# Patient Record
Sex: Male | Born: 1995 | Race: White | Hispanic: No | Marital: Single | Smoking: Former smoker
Health system: Southern US, Community
[De-identification: ages and names within clinical notes are randomized; demographics above are authoritative.]

## PROBLEM LIST (undated history)

## (undated) DIAGNOSIS — F32A Depression, unspecified: Secondary | ICD-10-CM

## (undated) DIAGNOSIS — B2790 Infectious mononucleosis, unspecified without complication: Secondary | ICD-10-CM

## (undated) DIAGNOSIS — F419 Anxiety disorder, unspecified: Secondary | ICD-10-CM

## (undated) DIAGNOSIS — S060XAA Concussion with loss of consciousness status unknown, initial encounter: Secondary | ICD-10-CM

## (undated) DIAGNOSIS — S0990XA Unspecified injury of head, initial encounter: Secondary | ICD-10-CM

## (undated) DIAGNOSIS — J45909 Unspecified asthma, uncomplicated: Secondary | ICD-10-CM

## (undated) DIAGNOSIS — S060X9A Concussion with loss of consciousness of unspecified duration, initial encounter: Secondary | ICD-10-CM

## (undated) DIAGNOSIS — F909 Attention-deficit hyperactivity disorder, unspecified type: Secondary | ICD-10-CM

## (undated) HISTORY — DX: Infectious mononucleosis, unspecified without complication: B27.90

## (undated) HISTORY — PX: TONSILLECTOMY: SUR1361

## (undated) HISTORY — PX: CIRCUMCISION: SHX1350

## (undated) HISTORY — PX: WISDOM TOOTH EXTRACTION: SHX21

## (undated) HISTORY — PX: ADENOIDECTOMY: SUR15

## (undated) HISTORY — PX: TYMPANOSTOMY TUBE PLACEMENT: SHX32

---
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
## 2017-07-29 DIAGNOSIS — B2790 Infectious mononucleosis, unspecified without complication: Secondary | ICD-10-CM

## 2017-07-29 HISTORY — DX: Infectious mononucleosis, unspecified without complication: B27.90

## 2017-08-10 ENCOUNTER — Ambulatory Visit (INDEPENDENT_AMBULATORY_CARE_PROVIDER_SITE_OTHER): Payer: Self-pay | Admitting: Psychiatry

## 2017-08-10 ENCOUNTER — Encounter: Payer: Self-pay | Admitting: Psychiatry

## 2017-08-10 VITALS — BP 125/74 | HR 86 | Temp 98.5°F | Wt 140.0 lb

## 2017-08-10 DIAGNOSIS — R6889 Other general symptoms and signs: Secondary | ICD-10-CM

## 2017-08-10 LAB — POCT INFLUENZA A/B
INFLUENZA B, POC: NEGATIVE
Influenza A, POC: NEGATIVE

## 2017-08-10 MED ORDER — BENZONATATE 100 MG PO CAPS: 100.0000 mg | ORAL_CAPSULE | Freq: Two times a day (BID) | ORAL | 0 refills | Status: DC | PRN

## 2017-08-10 MED ORDER — ONDANSETRON HCL 4 MG PO TABS: 4.0000 mg | ORAL_TABLET | Freq: Three times a day (TID) | ORAL | 0 refills | Status: DC | PRN

## 2017-08-10 MED ORDER — CIPROFLOXACIN HCL 500 MG PO TABS: 500.0000 mg | ORAL_TABLET | Freq: Two times a day (BID) | ORAL | 0 refills | Status: AC

## 2017-08-10 NOTE — Patient Instructions (Addendum)
Nausea and Vomiting, Adult Feeling sick to your stomach (nausea) means that your stomach is upset or you feel like you have to throw up (vomit). Feeling more and more sick to your stomach can lead to throwing up. Throwing up happens when food and liquid from your stomach are thrown up and out the mouth. Throwing up can make you feel weak and cause you to get dehydrated. Dehydration can make you tired and thirsty, make you have a dry mouth, and make it so you pee (urinate) less often. Older adults and people with other diseases or a weak defense system (immune system) are at higher risk for dehydration. If you feel sick to your stomach or if you throw up, it is important to follow instructions from your doctor about how to take care of yourself. Follow these instructions at home: Eating and drinking Follow these instructions as told by your doctor:  Take an oral rehydration solution (ORS). This is a drink that is sold at pharmacies and stores.  Drink clear fluids in small amounts as you are able, such as: ? Water. ? Ice chips. ? Diluted fruit juice. ? Low-calorie sports drinks.  Eat bland, easy-to-digest foods in small amounts as you are able, such as: ? Bananas. ? Applesauce. ? Rice. ? Low-fat (lean) meats. ? Toast. ? Crackers.  Avoid fluids that have a lot of sugar or caffeine in them.  Avoid alcohol.  Avoid spicy or fatty foods.  General instructions  Drink enough fluid to keep your pee (urine) clear or pale yellow.  Wash your hands often. If you cannot use soap and water, use hand sanitizer.  Make sure that all people in your home wash their hands well and often.  Take over-the-counter and prescription medicines only as told by your doctor.  Rest at home while you get better.  Watch your condition for any changes.  Breathe slowly and deeply when you feel sick to your stomach.  Keep all follow-up visits as told by your doctor. This is important. Contact a doctor  if:  You have a fever.  You cannot keep fluids down.  Your symptoms get worse.  You have new symptoms.  You feel sick to your stomach for more than two days.  You feel light-headed or dizzy.  You have a headache.  You have muscle cramps. Get help right away if:  You have pain in your chest, neck, arm, or jaw.  You feel very weak or you pass out (faint).  You throw up again and again.  You see blood in your throw-up.  Your throw-up looks like black coffee grounds.  You have bloody or black poop (stools) or poop that look like tar.  You have a very bad headache, a stiff neck, or both.  You have a rash.  You have very bad pain, cramping, or bloating in your belly (abdomen).  You have trouble breathing.  You are breathing very quickly.  Your heart is beating very quickly.  Your skin feels cold and clammy.  You feel confused.  You have pain when you pee.  You have signs of dehydration, such as: ? Dark pee, hardly any pee, or no pee. ? Cracked lips. ? Dry mouth. ? Sunken eyes. ? Sleepiness. ? Weakness. These symptoms Lia be an emergency. Do not wait to see if the symptoms will go away. Get medical help right away. Call your local emergency services (911 in the [place] Do not drive yourself to the hospital. This information is   not intended to replace advice given to you by your health care provider. Make sure you discuss any questions you have with your health care provider. Document Released: 12/01/2007 Document Revised: 01/02/2016 Document Reviewed: 02/18/2015 Elsevier Interactive Patient Education  2018 [employer]  

## 2017-08-10 NOTE — Progress Notes (Signed)
  Subjective:     Ricardo Stevens is a 22 y.o. male who presents to [facility] accompanied by his mother for evaluation of flu like symptoms. Patient reports feeling generalized bodyache, feverish, chills. He reports symptoms started all of a sudden since last night. he reports being around his sister and her daughter who have been diagnosed with flu. He reports throwing up this morning and ongoing nausea. He denies any chest pain, abdominal pain. He reports fever. He has not tried anything. He reports generalized malaise and decreased appetite. He denies any recent  The following portions of the patient's history were reviewed and updated as appropriate: allergies, current medications, past family history, past medical history, past social history, past surgical history and problem list.  Review of Systems Pertinent items are noted in HPI.    Objective:     General appearance: alert, cooperative and fatigued Head: Normocephalic, without obvious abnormality, atraumatic Eyes: conjunctivae/corneas clear. PERRL, EOM's intact. Fundi benign. Ears: abnormal TM right ear - normal landmarks and mobility and abnormal TM left ear - serous middle ear fluid Nose: Nares normal. Septum midline. Mucosa normal. No drainage or sinus tenderness., turbinates red, inflamed Throat: lips, mucosa, and tongue normal; teeth and gums normal Lungs: clear to auscultation bilaterally Heart: regular rate and rhythm, S1, S2 normal, no murmur, click, rub or gallop Abdomen: abnormal findings:  hypoactive bowel sounds  Fruity breathe   Results for orders placed or performed in visit on 08/10/17 (from the past 24 hour(s))  POCT Influenza A/B     Status: Normal   Collection Time: 08/10/17  3:03 PM  Result Value Ref Range   Influenza A, POC Negative Negative   Influenza B, POC Negative Negative    Assessment:   Ricardo Stevens was seen today for choice-bodyache-chills vomit last nite.  Diagnoses and all orders for this  visit:  Flu-like symptoms -     ciprofloxacin (CIPRO) 500 MG tablet; Take 1 tablet (500 mg total) by mouth 2 (two) times daily for 6 days. -     ondansetron (ZOFRAN) 4 MG tablet; Take 1 tablet (4 mg total) by mouth every 8 (eight) hours as needed for nausea. -     POCT Influenza A/B  Other orders -     benzonatate (TESSALON) 100 MG capsule; Take 1 capsule (100 mg total) by mouth 2 (two) times daily as needed for cough.      Plan:    1. Discussed oral rehydration, reintroduction of solid foods, signs of dehydration. 2. Return or go to emergency department if worsening symptoms, blood or bile, signs of dehydration, diarrhea lasting longer than 5 days or any new concerns. 3. Follow PRN.

## 2017-08-13 ENCOUNTER — Ambulatory Visit (INDEPENDENT_AMBULATORY_CARE_PROVIDER_SITE_OTHER): Payer: Self-pay | Admitting: Medical

## 2017-08-13 ENCOUNTER — Encounter (HOSPITAL_COMMUNITY): Payer: Self-pay | Admitting: Family Medicine

## 2017-08-13 ENCOUNTER — Encounter: Payer: Self-pay | Admitting: Medical

## 2017-08-13 ENCOUNTER — Ambulatory Visit (HOSPITAL_COMMUNITY)
Admission: EM | Admit: 2017-08-13 | Discharge: 2017-08-13 | Disposition: A | Discharge status: Home / Self Care | Payer: 59 | Attending: Family Medicine | Admitting: Family Medicine

## 2017-08-13 ENCOUNTER — Ambulatory Visit (INDEPENDENT_AMBULATORY_CARE_PROVIDER_SITE_OTHER): Payer: 59

## 2017-08-13 VITALS — BP 128/78 | HR 81 | Temp 98.4°F | Resp 18 | Wt 146.4 lb

## 2017-08-13 DIAGNOSIS — F909 Attention-deficit hyperactivity disorder, unspecified type: Secondary | ICD-10-CM | POA: Diagnosis not present

## 2017-08-13 DIAGNOSIS — J45909 Unspecified asthma, uncomplicated: Secondary | ICD-10-CM | POA: Insufficient documentation

## 2017-08-13 DIAGNOSIS — R630 Anorexia: Secondary | ICD-10-CM | POA: Diagnosis not present

## 2017-08-13 DIAGNOSIS — R197 Diarrhea, unspecified: Secondary | ICD-10-CM | POA: Diagnosis not present

## 2017-08-13 DIAGNOSIS — F17210 Nicotine dependence, cigarettes, uncomplicated: Secondary | ICD-10-CM | POA: Insufficient documentation

## 2017-08-13 DIAGNOSIS — R509 Fever, unspecified: Secondary | ICD-10-CM | POA: Insufficient documentation

## 2017-08-13 DIAGNOSIS — R1084 Generalized abdominal pain: Secondary | ICD-10-CM

## 2017-08-13 DIAGNOSIS — R112 Nausea with vomiting, unspecified: Secondary | ICD-10-CM

## 2017-08-13 DIAGNOSIS — R51 Headache: Secondary | ICD-10-CM | POA: Diagnosis not present

## 2017-08-13 LAB — CBC
HCT: 43.9 % (ref 39.0–52.0)
HEMOGLOBIN: 15.3 g/dL (ref 13.0–17.0)
MCH: 30.9 pg (ref 26.0–34.0)
MCHC: 34.9 g/dL (ref 30.0–36.0)
MCV: 88.7 fL (ref 78.0–100.0)
PLATELETS: 180 10*3/uL (ref 150–400)
RBC: 4.95 MIL/uL (ref 4.22–5.81)
RDW: 12.6 % (ref 11.5–15.5)
WBC: 4.4 10*3/uL (ref 4.0–10.5)

## 2017-08-13 LAB — POCT I-STAT, CHEM 8
BUN: 15 mg/dL (ref 6–20)
CALCIUM ION: 1.15 mmol/L (ref 1.15–1.40)
Chloride: 99 mmol/L — ABNORMAL LOW (ref 101–111)
Creatinine, Ser: 1.1 mg/dL (ref 0.61–1.24)
Glucose, Bld: 91 mg/dL (ref 65–99)
HEMATOCRIT: 45 % (ref 39.0–52.0)
HEMOGLOBIN: 15.3 g/dL (ref 13.0–17.0)
Potassium: 4.9 mmol/L (ref 3.5–5.1)
SODIUM: 137 mmol/L (ref 135–145)
TCO2: 29 mmol/L (ref 22–32)

## 2017-08-13 LAB — POCT URINALYSIS DIP (DEVICE)
GLUCOSE, UA: NEGATIVE mg/dL
Hgb urine dipstick: NEGATIVE
Ketones, ur: NEGATIVE mg/dL
Leukocytes, UA: NEGATIVE
Nitrite: NEGATIVE
PH: 5.5 (ref 5.0–8.0)
PROTEIN: 30 mg/dL — AB
Specific Gravity, Urine: >=1.03 (ref 1.005–1.030)
UROBILINOGEN UA: 1 mg/dL (ref 0.0–1.0)

## 2017-08-13 MED ORDER — ONDANSETRON 4 MG PO TBDP: 4.0000 mg | ORAL_TABLET | Freq: Once | ORAL | Status: DC

## 2017-08-13 MED ORDER — ONDANSETRON HCL 4 MG/2ML IJ SOLN
4.0000 mg | Freq: Once | INTRAMUSCULAR | Status: AC
  Administered 2017-08-13: 4 mg via INTRAVENOUS

## 2017-08-13 MED ORDER — SODIUM CHLORIDE 0.9 % IV SOLN
Freq: Once | INTRAVENOUS | Status: AC
  Administered 2017-08-13: 16:00:00 via INTRAVENOUS

## 2017-08-13 MED ORDER — ONDANSETRON HCL 4 MG/2ML IJ SOLN
INTRAMUSCULAR | Status: AC
  Filled 2017-08-13: qty 2

## 2017-08-13 MED ORDER — ONDANSETRON 4 MG PO TBDP: 4.0000 mg | ORAL_TABLET | Freq: Three times a day (TID) | ORAL | 0 refills | Status: DC | PRN

## 2017-08-13 NOTE — Progress Notes (Signed)
Subjective:  Ricardo Stevens is a 22 y.o. male who presents for evaluation of N/V.  Symptoms include achiness, no fever and N/V.  Onset of symptoms was 5 days ago, and has been unchanged since that time.  Treatment to date:  Zofran, Cipro and Tesselon Pearls.  High risk factors for influenza complications:  none. Patient was [facility] 08/10/17 and had negative flu test. Was given Cipro, Zofran and Tesselon pearls for gastritis. Unable to keep anything down since then even with Zofran. No BM since Tuesday. Vomit is mostly bile with blood tinge. Fever up to 100.3 F yesterday. Patient denies abdominal pain but is having body aches.   The following portions of the patient's history were reviewed and updated as appropriate:  allergies, current medications and past medical history.  Pertinent items are noted in HPI. Objective:   Physical Exam  Constitutional: He is oriented to person, place, and time. He appears well-developed. He appears ill. No distress.  HENT:  Head: Normocephalic.  Eyes: EOM are normal.  Cardiovascular: Normal rate.  Respiratory: Effort normal.  GI: Soft. He exhibits no distension and no mass. Bowel sounds are decreased. There is tenderness (mild diffuse, no focal tenderness). There is no rebound and no guarding.  Neurological: He is alert and oriented to person, place, and time.  Skin: Skin is warm and dry. No erythema. There is pallor.  Psychiatric: He has a normal mood and affect. His behavior is normal.   MDM No concern fo acute abdomen Signs of dehydration and failed PO anti-emetics  Called [facility]. They are able to provide IV fluids and anti-emetics. This was at the request of the patient's mother who does not want to wait or pay for ER visit if not needed  Assessment:  Likely viral gastroenteritis Signs of dehydration  Plan:  [facility] for possible IV fluids and anti-emetics as well as further evaluation and management of  symptoms  Marny LowensteinWenzel, Modene Andy N, PA-C 08/13/2017 1:13 PM

## 2017-08-13 NOTE — Patient Instructions (Addendum)
Dehydration, Adult Dehydration is when there is not enough fluid or water in your body. This happens when you lose more fluids than you take in. Dehydration can range from mild to very bad. It should be treated right away to keep it from getting very bad. Symptoms of mild dehydration [place] include:  Thirst.  Dry lips.  Slightly dry mouth.  Dry, warm skin.  Dizziness. Symptoms of moderate dehydration [place] include:  Very dry mouth.  Muscle cramps.  Dark pee (urine). Pee Purves be the color of tea.  Your body making less pee.  Your eyes making fewer tears.  Heartbeat that is uneven or faster than normal (palpitations).  Headache.  Light-headedness, especially when you stand up from sitting.  Fainting (syncope). Symptoms of very bad dehydration [place] include:  Changes in skin, such as: ? Cold and clammy skin. ? Blotchy (mottled) or pale skin. ? Skin that does not quickly return to normal after being lightly pinched and let go (poor skin turgor).  Changes in body fluids, such as: ? Feeling very thirsty. ? Your eyes making fewer tears. ? Not sweating when body temperature is high, such as in hot weather. ? Your body making very little pee.  Changes in vital signs, such as: ? Weak pulse. ? Pulse that is more than 100 beats a minute when you are sitting still. ? Fast breathing. ? Low blood pressure.  Other changes, such as: ? Sunken eyes. ? Cold hands and feet. ? Confusion. ? Lack of energy (lethargy). ? Trouble waking up from sleep. ? Short-term weight loss. ? Unconsciousness. Follow these instructions at home:  If told by your doctor, drink an ORS: ? Make an ORS by using instructions on the package. ? Start by drinking small amounts, about  cup (120 mL) every 5-10 minutes. ? Slowly drink more until you have had the amount that your doctor said to have.  Drink enough clear fluid to keep your pee clear or pale yellow. If you were told to drink an ORS, finish the ORS  first, then start slowly drinking clear fluids. Drink fluids such as: ? Water. Do not drink only water by itself. Doing that can make the salt (sodium) level in your body get too low (hyponatremia). ? Ice chips. ? Fruit juice that you have added water to (diluted). ? Low-calorie sports drinks.  Avoid: ? Alcohol. ? Drinks that have a lot of sugar. These include high-calorie sports drinks, fruit juice that does not have water added, and soda. ? Caffeine. ? Foods that are greasy or have a lot of fat or sugar.  Take over-the-counter and prescription medicines only as told by your doctor.  Do not take salt tablets. Doing that can make the salt level in your body get too high (hypernatremia).  Eat foods that have minerals (electrolytes). Examples include bananas, oranges, potatoes, tomatoes, and spinach.  Keep all follow-up visits as told by your doctor. This is important. Contact a doctor if:  You have belly (abdominal) pain that: ? Gets worse. ? Stays in one area (localizes).  You have a rash.  You have a stiff neck.  You get angry or annoyed more easily than normal (irritability).  You are more sleepy than normal.  You have a harder time waking up than normal.  You feel: ? Weak. ? Dizzy. ? Very thirsty.  You have peed (urinated) only a small amount of very dark pee during 6-8 hours. Get help right away if:  You have symptoms of   very bad dehydration.  You cannot drink fluids without throwing up (vomiting).  Your symptoms get worse with treatment.  You have a fever.  You have a very bad headache.  You are throwing up or having watery poop (diarrhea) and it: ? Gets worse. ? Does not go away.  You have blood or something green (bile) in your throw-up.  You have blood in your poop (stool). This Kelly cause poop to look black and tarry.  You have not peed in 6-8 hours.  You pass out (faint).  Your heart rate when you are sitting still is more than 100 beats a  minute.  You have trouble breathing. This information is not intended to replace advice given to you by your health care provider. Make sure you discuss any questions you have with your health care provider. Document Released: 04/10/2009 Document Revised: 01/02/2016 Document Reviewed: 08/08/2015 Elsevier Interactive Patient Education  2018 [employer]   Go straight to Urgent Care @ [address], [place], [place] [address] for further evaluation and management of your symptoms

## 2017-08-13 NOTE — ED Provider Notes (Signed)
[facility]   [id number] 08/13/17 Arrival Time: 1340  ASSESSMENT & PLAN:  1. Non-intractable vomiting with nausea, unspecified vomiting type   2. Abdominal discomfort, generalized    Meds ordered this encounter  Medications  . 0.9 %  sodium chloride infusion  . ondansetron (ZOFRAN) injection 4 mg  . ondansetron (ZOFRAN-ODT) 4 MG disintegrating tablet    Sig: Take 1 tablet (4 mg total) by mouth every 8 (eight) hours as needed for nausea or vomiting.    Dispense:  15 tablet    Refill:  0   Feeling much better after IVF+ondansetron. Observe closely over the next 24-48 hours. Will f/u with PCP or here if needed. D/C Cipro. Doubt bacterial infection.  Will do his best to ensure adequate fluid intake in order to avoid dehydration. Will proceed to the Emergency Department for evaluation if unable to tolerate PO fluids regularly.  Otherwise he ill f/u with his PCP or here if not showing improvement over the next 48-72 hours.  Reviewed expectations re: course of current medical issues. Questions answered. Outlined signs and symptoms indicating need for more acute intervention. Patient verbalized understanding. After Visit Summary given.   SUBJECTIVE: History from: patient and caregiver.  Ricardo Stevens is a 22 y.o. male who presents with complaint of intermittent nausea and vomiting of brown material, no blood. Onset abrupt, 3 days ago. Abdominal discomfort: mild and cramping, esp after vomiting. Symptoms are unchanged since beginning. Aggravating factors: eating. Alleviating factors: fasting. Associated symptoms: anorexia, chills, fever and headache. He denies diarrhea. Appetite: decreased. PO intake: decreased. Ambulatory without assistance. Urinary symptoms: none. Last bowel movement 2-3 days ago without blood. OTC treatment: none. Went to an urgent care and was given Cipro and Zofran. Zofran helping with nausea. No recent travel reported. No sick contacts.  Past Surgical  History:  Procedure Laterality Date  . ADENOIDECTOMY    . CIRCUMCISION    . TONSILLECTOMY    . TYMPANOSTOMY TUBE PLACEMENT Bilateral     ROS: As per HPI. All other systems negative.  OBJECTIVE:  Vitals:   08/13/17 1430  BP: 121/82  Pulse: 83  Resp: 18  Temp: 99.3 F (37.4 C)  TempSrc: Oral  SpO2: 99%    General appearance: alert; no distress but appears fatigued Oropharynx: dry Lungs: unlabored respirations; symmetrical air entry Heart: regular rate and rhythm Abdomen: soft; non-distended; no significant abdominal tenderness, "just a cramping feeling"; bowel sounds present; no masses or organomegaly; no guarding or rebound tenderness Back: no CVA tenderness Extremities: no edema; symmetrical with no gross deformities Skin: warm and dry Neurologic: normal gait Psychological: alert and cooperative; normal mood and affect  Labs: Results for orders placed or performed during the hospital encounter of 08/13/17  CBC  Result Value Ref Range   WBC 4.4 4.0 - 10.5 K/uL   RBC 4.95 4.22 - 5.81 MIL/uL   Hemoglobin 15.3 13.0 - 17.0 g/dL   HCT 21.3 08.6 - 57.8 %   MCV 88.7 78.0 - 100.0 fL   MCH 30.9 26.0 - 34.0 pg   MCHC 34.9 30.0 - 36.0 g/dL   RDW 46.9 62.9 - 52.8 %   Platelets 180 150 - 400 K/uL  POCT urinalysis dip (device)  Result Value Ref Range   Glucose, UA NEGATIVE NEGATIVE mg/dL   Bilirubin Urine SMALL (A) NEGATIVE   Ketones, ur NEGATIVE NEGATIVE mg/dL   Specific Gravity, Urine >=1.030 1.005 - 1.030   Hgb urine dipstick NEGATIVE NEGATIVE   pH 5.5 5.0 -  8.0   Protein, ur 30 (A) NEGATIVE mg/dL   Urobilinogen, UA 1.0 0.0 - 1.0 mg/dL   Nitrite NEGATIVE NEGATIVE   Leukocytes, UA NEGATIVE NEGATIVE  I-STAT, chem 8  Result Value Ref Range   Sodium 137 135 - 145 mmol/L   Potassium 4.9 3.5 - 5.1 mmol/L   Chloride 99 (L) 101 - 111 mmol/L   BUN 15 6 - 20 mg/dL   Creatinine, Ser 1.611.10 0.61 - 1.24 mg/dL   Glucose, Bld 91 65 - 99 mg/dL   Calcium, Ion 0.961.15 0.451.15 - 1.40  mmol/L   TCO2 29 22 - 32 mmol/L   Hemoglobin 15.3 13.0 - 17.0 g/dL   HCT 40.945.0 81.139.0 - 91.452.0 %   Imaging: Dg Abd 2 Views  Result Date: 08/13/2017 CLINICAL DATA:  Nausea vomiting and diarrhea. EXAM: ABDOMEN - 2 VIEW COMPARISON:  09/12/2006 FINDINGS: Upright film shows no evidence for intraperitoneal free air. There is no evidence for gaseous bowel dilation to suggest obstruction. No unexpected abdominopelvic calcification. Visualized bony anatomy unremarkable. IMPRESSION: Negative. Electronically Signed   By: Kennith CenterEric  Mansell M.D.   On: 08/13/2017 15:15    No Known Allergies                                             Past Medical History:  Diagnosis Date  . ADHD (attention deficit hyperactivity disorder)   . Asthma   . Concussion   . Head injury, acute, without loss of consciousness    Social History   Socioeconomic History  . Marital status: Single    Spouse name: Not on file  . Number of children: Not on file  . Years of education: Not on file  . Highest education level: Not on file  Social Needs  . Financial resource strain: Not on file  . Food insecurity - worry: Not on file  . Food insecurity - inability: Not on file  . Transportation needs - medical: Not on file  . Transportation needs - non-medical: Not on file  Occupational History  . Not on file  Tobacco Use  . Smoking status: Current Every Day Smoker    Types: Cigarettes  . Smokeless tobacco: Never Used  Substance and Sexual Activity  . Alcohol use: Yes    Comment: has drank beer in past, not regularly  . Drug use: No  . Sexual activity: Yes    Birth control/protection: Condom  Other Topics Concern  . Not on file  Social History Narrative   Lives at home with Mom, Dad, and Sister. 3 dogs. Mom & Dad smoke outside, not in front of kids.   Family History  Problem Relation Age of Onset  . Hypertension Mother      Mardella LaymanHagler, Ricardo Carlile, MD 08/13/17 [phone]

## 2017-08-13 NOTE — Discharge Instructions (Signed)
Please do your best to ensure adequate fluid intake in order to avoid dehydration. If you find that you are unable to tolerate drinking fluids regularly please proceed to the Emergency Department for evaluation. ° °Also, you should return to the hospital if you experience persistent fevers for greater than 1-2 more days, increasing abdominal pain that persists despite medications, persistent diarrhea, dizziness, syncope (fainting), or for any other concerns you Cange deem worrisome. °

## 2017-08-13 NOTE — ED Triage Notes (Signed)
Pt here for N,V,D body aches, cough. This started Tuesday. unable to keep anything down. Severe vomiting last night.

## 2017-08-16 ENCOUNTER — Ambulatory Visit (INDEPENDENT_AMBULATORY_CARE_PROVIDER_SITE_OTHER): Payer: 59 | Admitting: Family Medicine

## 2017-08-16 ENCOUNTER — Other Ambulatory Visit: Payer: Self-pay

## 2017-08-16 ENCOUNTER — Encounter: Payer: Self-pay | Admitting: Family Medicine

## 2017-08-16 VITALS — BP 120/82 | HR 96 | Resp 16 | Wt 147.0 lb

## 2017-08-16 DIAGNOSIS — R509 Fever, unspecified: Secondary | ICD-10-CM

## 2017-08-16 DIAGNOSIS — R1110 Vomiting, unspecified: Secondary | ICD-10-CM

## 2017-08-16 DIAGNOSIS — R197 Diarrhea, unspecified: Secondary | ICD-10-CM | POA: Diagnosis not present

## 2017-08-16 LAB — POCT URINALYSIS DIPSTICK
GLUCOSE UA: NEGATIVE
Leukocytes, UA: NEGATIVE
Nitrite, UA: NEGATIVE
PH UA: 5.5 (ref 5.0–8.0)
Protein, UA: 30
RBC UA: NEGATIVE
Spec Grav, UA: >=1.03 — AB (ref 1.010–1.025)
UROBILINOGEN UA: 0.2 U/dL

## 2017-08-16 LAB — POCT INFLUENZA A/B
INFLUENZA A, POC: NEGATIVE
INFLUENZA B, POC: NEGATIVE

## 2017-08-16 LAB — POCT RAPID STREP A (OFFICE): RAPID STREP A SCREEN: NEGATIVE

## 2017-08-16 MED ORDER — CIPROFLOXACIN HCL 500 MG PO TABS
500.0000 mg | ORAL_TABLET | Freq: Two times a day (BID) | ORAL | 0 refills | Status: DC
Start: 2017-08-16 — End: 2018-01-31

## 2017-08-16 NOTE — Progress Notes (Signed)
Patient ID: Ricardo Stevens, male    DOB: 1995/07/04, 22 y.o.   MRN: [id number]  Chief Complaint  Patient presents with  . Fever    103.3 highest per patient, Sunday night  . Nausea    Allergies Patient has no known allergies.  Subjective:   Ricardo Stevens is a 22 y.o. male who presents to [facility] today.  HPI Ricardo Stevens presents today accompanied by his sister for an acute visit and to establish care.  On February 12,  he woke up in the morning feeling weak and sick.  He reports that he did not feel like getting out of bed but he did and he proceeded to eat breakfast and vomited.  He reports that he continued to vomit throughout the day and did not feel good.  The next [facility].  A flu test was performed which was negative.  He was given a prescription for Cipro and discharged home.  He continued to have fevers and vomiting.  Went to the urgent care clinic on 08/13/2017 and was told that they thought his symptoms were viral.  Was given IV fluids for dehydration, given Zofran as an anti-medics, and then told to discontinue the Cipro.  He had a CBC and BMP performed which were within normal limits.  He has had a flu test performed which was negative.  He has continued to vomit on and off since that time.  Then 2 days ago started having diarrhea.  Has proceeded to have diarrhea approximately 4-5 times a day, brown/clear, voluminous in quantity.  Has also had continued vomiting.  Does not feel dehydrated or dizzy.  Urinating well.  Denies any abdominal pain except has some cramping prior to the diarrhea.  All emesis has been nonbloody and nonbilious.  Emesis and diarrhea are worse with eating.  He is continued to eat whatever he wants and has been drinking Sprite or Gatorade.  Reports that he does feel better than he did when he was seen at the urgent care clinic on Saturday, February 16.  He is still proceeded to have fevers.  The highest fever he has  had was 2 days ago with a temperature of 103 degrees.  He has been using Advil 200 mg tablets, 1 pill, 4-5 times a day.  He denies any blood in his stool.  Denies any melena or bright red blood per rectum.  No hematochezia.  Denies any neck stiffness.  Denies any sore throat.  Denies any sick contacts.  Has not been out of the country or camping.   Diarrhea   This is a new problem. The current episode started in the past 7 days. The problem occurs 2 to 4 times per day. The problem has been unchanged. The stool consistency is described as watery. The patient states that diarrhea does not awaken him from sleep. Associated symptoms include a fever and vomiting. Pertinent negatives include no abdominal pain, arthralgias, chills, coughing, headaches, increased  flatus, myalgias, sweats, URI or weight loss. Associated symptoms comments: Reports abdominal cramping prior/preceding diarrhea.  Cramping resolved with diarrheal episode.. Nothing aggravates the symptoms. There are no known risk factors. He has tried increased fluids for the symptoms. The treatment provided mild relief.  Fever   This is a new problem. The current episode started in the past 7 days. The problem occurs intermittently. The maximum temperature noted was 103 to 103.9 F. The temperature was taken using  an oral thermometer. Associated symptoms include diarrhea, nausea and vomiting. Pertinent negatives include no abdominal pain, chest pain, congestion, coughing, ear pain, headaches, muscle aches, rash, sleepiness, sore throat, urinary pain or wheezing. He has tried fluids and NSAIDs for the symptoms. The treatment provided mild relief.  Risk factors: no contaminated food, no contaminated water, no hx of cancer, no immunosuppression, no occupational exposure, no recent sickness, no recent travel and no sick contacts     Past Medical History:  Diagnosis Date  . ADHD (attention deficit hyperactivity disorder)   . Asthma   . Concussion   .  Head injury, acute, without loss of consciousness     Past Surgical History:  Procedure Laterality Date  . ADENOIDECTOMY    . CIRCUMCISION    . TONSILLECTOMY    . TYMPANOSTOMY TUBE PLACEMENT Bilateral     Family History  Problem Relation Age of Onset  . Hypertension Mother   . Diabetes Maternal Grandmother   . Diabetes Maternal Grandfather   . Diabetes Paternal Grandmother   . Breast cancer Paternal Grandmother   . Diabetes Paternal Grandfather      Social History   Socioeconomic History  . Marital status: Single    Spouse name: None  . Number of children: None  . Years of education: None  . Highest education level: None  Social Needs  . Financial resource strain: None  . Food insecurity - worry: None  . Food insecurity - inability: None  . Transportation needs - medical: None  . Transportation needs - non-medical: None  Occupational History  . None  Tobacco Use  . Smoking status: Current Every Day Smoker    Types: Cigarettes  . Smokeless tobacco: Never Used  Substance and Sexual Activity  . Alcohol use: Yes    Comment: has drank beer in past, not regularly  . Drug use: No  . Sexual activity: Yes    Birth control/protection: Condom  Other Topics Concern  . None  Social History Narrative   Lives at home with Mom, Dad, and Sister. 3 dogs. Mom & Dad smoke outside, not in front of kids.    Review of Systems  Constitutional: Positive for fatigue and fever. Negative for activity change, appetite change, chills, diaphoresis, unexpected weight change and weight loss.       3 days diarrhea.   HENT: Negative for congestion, dental problem, ear pain, hearing loss, rhinorrhea, sore throat, trouble swallowing and voice change.   Eyes: Negative for visual disturbance.  Respiratory: Negative for cough, chest tightness, shortness of breath and wheezing.   Cardiovascular: Negative for chest pain, palpitations and leg swelling.  Gastrointestinal: Positive for diarrhea,  nausea and vomiting. Negative for abdominal distention, abdominal pain, anal bleeding, blood in stool, constipation, flatus and rectal pain.       3-4 times, watery, loose, come on all of a suddden.  crampy stools.   No travel.   Endocrine: Negative for polydipsia, polyphagia and polyuria.  Genitourinary: Negative for decreased urine volume, difficulty urinating, discharge, dysuria, flank pain, frequency, penile pain, penile swelling, scrotal swelling, testicular pain and urgency.  Musculoskeletal: Negative for arthralgias, myalgias, neck pain and neck stiffness.  Skin: Negative for rash.  Allergic/Immunologic: Negative for immunocompromised state.  Neurological: Negative for dizziness, tremors, syncope, weakness and headaches.  Hematological: Negative for adenopathy. Does not bruise/bleed easily.     Objective:   BP 120/82 (BP Location: Right Arm, Patient Position: Sitting, Cuff Size: Normal)   Pulse 96   Resp 16  Wt 147 lb (66.7 kg)   SpO2 98%   BMI 23.73 kg/m   Physical Exam  Constitutional: He is oriented to person, place, and time. He appears well-developed and well-nourished. No distress.  HENT:  Head: Normocephalic and atraumatic.  Nose: Nose normal.  Mouth/Throat: Oropharynx is clear and moist. No oropharyngeal exudate.  Eyes: Conjunctivae and EOM are normal. Pupils are equal, round, and reactive to light. No scleral icterus.  Neck: Normal range of motion. Neck supple. No JVD present. No tracheal deviation present. No thyromegaly present.  Cardiovascular: Normal rate, regular rhythm and normal heart sounds.  Pulses:      Dorsalis pedis pulses are 2+ on the right side, and 2+ on the left side.  Pulmonary/Chest: Effort normal and breath sounds normal. No stridor. No respiratory distress. He has no wheezes.  Abdominal: Soft. Bowel sounds are normal. There is no tenderness. There is no guarding.  Mild, periumbilical tenderness to palpation.  No hepatosplenomegaly palpated.    Musculoskeletal: Normal range of motion. He exhibits no edema.  Lymphadenopathy:    He has no cervical adenopathy.  Neurological: He is alert and oriented to person, place, and time. No cranial nerve deficit. Coordination normal.  Skin: Skin is warm, dry and intact. Capillary refill takes less than 2 seconds. He is not diaphoretic. No pallor.  Psychiatric: He has a normal mood and affect. His behavior is normal. Thought content normal.  Vitals reviewed.  Plan of care urine test done in office today and reviewed and discussed with patient.  Assessment and Plan  1. Fever, unspecified fever cause Patient with persistent fever, initially thought by [facility] secondary to upper respiratory infection/VGE. POCT today done and Rapid strep test and flu test have been negative.  At this time suspect that he has a bacterial gastroenteritis.  We will go ahead and perform stool culture and GI panel by PCR.  Patient does not appear dehydrated at this time.  He reports he is drinking well and making good urine.  He defers any labs today and does not want them done.  We will go ahead and start Cipro 500 mg 1 p.o. twice daily times 7 days at this time.  Patient is to call back in 24 hours and if he is not improving then he will need to come in for additional blood work.  He was counseled that if he at any point developed abdominal pain outside of just the mild cramping with the diarrhea that he needed to go to the emergency department for evaluation.  We discussed signs and symptoms consistent with dehydration and if those do occur he should go to the emergency department for evaluation and IV fluids.  He was told that he could use the Zofran 4 mg tablets, 2 p.o. 3 times daily as needed nausea and vomiting.  We discussed an appropriate bland diet.  We discussed that his diet has not been appropriate considering his symptoms.  Patient was counseled concerning worrisome signs and symptoms and told to call  or follow-up if they develop.  He voiced understanding. -Point-of-care urine test performed which revealed evidence of mild dehydration.  Glucose negative.  Protein in urine slight.  No evidence of infection. - POCT rapid strep A - POCT Influenza A/B  2. Vomiting and diarrhea  - POCT Urinalysis Dipstick - Stool Culture - Gastrointestinal Panel by PCR , Stool - ciprofloxacin (CIPRO) 500 MG tablet; Take 1 tablet (500 mg total) by mouth 2 (two) times daily.  Dispense: 14  tablet; Refill: 0 Patient counseled in detail regarding the risks of medication. Told to call or return to clinic if develop any worrisome signs or symptoms. Patient voiced understanding.   Return if symptoms worsen or fail to improve. Aliene Beamsachel Lilly Gasser, MD 08/16/2017

## 2017-08-17 ENCOUNTER — Telehealth: Payer: Self-pay | Admitting: Family Medicine

## 2017-08-17 DIAGNOSIS — R112 Nausea with vomiting, unspecified: Secondary | ICD-10-CM

## 2017-08-18 ENCOUNTER — Ambulatory Visit (HOSPITAL_COMMUNITY): Payer: 59

## 2017-08-18 ENCOUNTER — Other Ambulatory Visit: Payer: Self-pay

## 2017-08-18 ENCOUNTER — Other Ambulatory Visit: Payer: Self-pay | Admitting: Family Medicine

## 2017-08-18 ENCOUNTER — Ambulatory Visit (HOSPITAL_COMMUNITY)
Admission: RE | Admit: 2017-08-18 | Discharge: 2017-08-18 | Disposition: A | Discharge status: Home / Self Care | Payer: 59 | Source: Ambulatory Visit | Attending: Family Medicine | Admitting: Family Medicine

## 2017-08-18 ENCOUNTER — Ambulatory Visit (INDEPENDENT_AMBULATORY_CARE_PROVIDER_SITE_OTHER): Payer: 59 | Admitting: Family Medicine

## 2017-08-18 ENCOUNTER — Encounter: Payer: Self-pay | Admitting: Family Medicine

## 2017-08-18 ENCOUNTER — Other Ambulatory Visit (HOSPITAL_COMMUNITY)
Admission: RE | Admit: 2017-08-18 | Discharge: 2017-08-18 | Disposition: A | Discharge status: Home / Self Care | Payer: 59 | Source: Ambulatory Visit | Attending: Family Medicine | Admitting: Family Medicine

## 2017-08-18 VITALS — BP 128/70 | HR 97 | Temp 99.3°F | Resp 16 | Wt 145.0 lb

## 2017-08-18 DIAGNOSIS — R1110 Vomiting, unspecified: Secondary | ICD-10-CM | POA: Diagnosis not present

## 2017-08-18 DIAGNOSIS — R109 Unspecified abdominal pain: Secondary | ICD-10-CM | POA: Diagnosis not present

## 2017-08-18 DIAGNOSIS — R112 Nausea with vomiting, unspecified: Secondary | ICD-10-CM

## 2017-08-18 DIAGNOSIS — R161 Splenomegaly, not elsewhere classified: Secondary | ICD-10-CM | POA: Insufficient documentation

## 2017-08-18 DIAGNOSIS — Z113 Encounter for screening for infections with a predominantly sexual mode of transmission: Secondary | ICD-10-CM

## 2017-08-18 DIAGNOSIS — R300 Dysuria: Secondary | ICD-10-CM | POA: Diagnosis not present

## 2017-08-18 DIAGNOSIS — D735 Infarction of spleen: Secondary | ICD-10-CM | POA: Diagnosis not present

## 2017-08-18 DIAGNOSIS — R509 Fever, unspecified: Secondary | ICD-10-CM

## 2017-08-18 HISTORY — DX: Splenomegaly, not elsewhere classified: R16.1

## 2017-08-18 LAB — POCT URINALYSIS DIPSTICK
Bilirubin, UA: NEGATIVE
Blood, UA: NEGATIVE
GLUCOSE UA: NEGATIVE
KETONES UA: NEGATIVE
Leukocytes, UA: NEGATIVE
Nitrite, UA: NEGATIVE
Protein, UA: NEGATIVE
Spec Grav, UA: <=1.005 — AB (ref 1.010–1.025)
UROBILINOGEN UA: 0.2 U/dL
pH, UA: 5.5 (ref 5.0–8.0)

## 2017-08-18 MED ORDER — IOPAMIDOL (ISOVUE-300) INJECTION 61%
100.0000 mL | Freq: Once | INTRAVENOUS | Status: AC | PRN
  Administered 2017-08-18: 100 mL via INTRAVENOUS

## 2017-08-18 MED ORDER — IOPAMIDOL (ISOVUE-300) INJECTION 61%: 100.0000 mL | Freq: Once | INTRAVENOUS | Status: DC | PRN

## 2017-08-18 NOTE — Telephone Encounter (Signed)
Ordered and scheduled CT as directed by Dr .Tracie HarrierHagler.

## 2017-08-18 NOTE — Progress Notes (Deleted)
    Patient ID: Ricardo Stevens, male    DOB: December 08, 1995, 22 y.o.   MRN: [id number]  Chief Complaint  Patient presents with  . Follow-up    Allergies Patient has no known allergies.  Subjective:   Ricardo Stevens is a 22 y.o. male who [facility].  HPI HPI  Past Medical History:  Diagnosis Date  . ADHD (attention deficit hyperactivity disorder)   . Asthma   . Concussion   . Head injury, acute, without loss of consciousness     Past Surgical History:  Procedure Laterality Date  . ADENOIDECTOMY    . CIRCUMCISION    . TONSILLECTOMY    . TYMPANOSTOMY TUBE PLACEMENT Bilateral     Family History  Problem Relation Age of Onset  . Hypertension Mother   . Diabetes Maternal Grandmother   . Diabetes Maternal Grandfather   . Diabetes Paternal Grandmother   . Breast cancer Paternal Grandmother   . Diabetes Paternal Grandfather      Social History   Socioeconomic History  . Marital status: Single    Spouse name: Not on file  . Number of children: Not on file  . Years of education: Not on file  . Highest education level: Not on file  Social Needs  . Financial resource strain: Not on file  . Food insecurity - worry: Not on file  . Food insecurity - inability: Not on file  . Transportation needs - medical: Not on file  . Transportation needs - non-medical: Not on file  Occupational History  . Not on file  Tobacco Use  . Smoking status: Current Every Day Smoker    Types: Cigarettes  . Smokeless tobacco: Never Used  Substance and Sexual Activity  . Alcohol use: Yes    Comment: has drank beer in past, not regularly  . Drug use: No  . Sexual activity: Yes    Birth control/protection: Condom  Other Topics Concern  . Not on file  Social History Narrative   Lives at home with Mom, Dad, and Sister. 3 dogs. Mom & Dad smoke outside, not in front of kids.    Review of Systems   Objective:   BP 128/70 (BP Location: Left Arm, Patient Position:  Sitting, Cuff Size: Normal)   Pulse 97   Temp 99.3 F (37.4 C) (Oral)   Resp 16   Wt 145 lb (65.8 kg)   SpO2 98%   BMI 23.40 kg/m   Physical Exam   Assessment and Plan   [facility], he was reading the scan,    No Follow-up on file. Mack HookBreanna  Johnson, LPN 4/09/81192/21/2019

## 2017-08-18 NOTE — Progress Notes (Signed)
Patient ID: Ricardo Stevens, male    DOB: 09-28-1995, 22 y.o.   MRN: [id number]  Chief Complaint  Patient presents with  . Follow-up    Allergies Patient has no known allergies.  Subjective:   Ricardo Stevens is a 22 y.o. male who [facility].  HPI Ricardo FearingJames presents back to the office today for a follow-up.  He had a CT scan of the abdomen and pelvis performed this morning at [facility].  He has continued to have fevers and vomiting since he was last seen here.  He reports that he has been able to keep down solids but if he drinks a lot of fluids he does have vomiting.  He reports that the emesis is nonbloody and nonbilious.  Reports that it is acidic in nature and burns his throat.  He reports that last night he did have a worsening in his abdominal pain and reports that he has had some right-sided upper and right, lower abdominal pain.  He reports he does have some burning with urination.  He reports that nothing seems to make the pain better or worse.  The pain is intermittent and occasionally worse with movement.  He denies any penile discharge.  He has not had a bowel movement in 2 days.  He has had no more diarrhea.  He has eaten toast, Jamaica[place] fries and bread without difficulty.  His fevers last night were as high as 102 degrees.  He reports he is urinating normally without urgency or frequency but does have a little bit of stinging when he urinates.  He reports this is been going on for about 1 week. He does report some fatigue.  He is not passed out.  He does not feel terribly weak.  He feels like his heart is beating okay.  Denies any shortness of breath or palpitations.  He denies any tobacco, alcohol or drug use.  He is sexually active but has not ever been treated for any sexually transmitted infections.  He denies any sore throat or cough.  He denies any weight loss.    Past Medical History:  Diagnosis Date  . ADHD (attention deficit hyperactivity  disorder)   . Asthma   . Concussion   . Head injury, acute, without loss of consciousness     Past Surgical History:  Procedure Laterality Date  . ADENOIDECTOMY    . CIRCUMCISION    . TONSILLECTOMY    . TYMPANOSTOMY TUBE PLACEMENT Bilateral     Family History  Problem Relation Age of Onset  . Hypertension Mother   . Diabetes Maternal Grandmother   . Diabetes Maternal Grandfather   . Diabetes Paternal Grandmother   . Breast cancer Paternal Grandmother   . Diabetes Paternal Grandfather      Social History   Socioeconomic History  . Marital status: Single    Spouse name: Not on file  . Number of children: Not on file  . Years of education: Not on file  . Highest education level: Not on file  Social Needs  . Financial resource strain: Not on file  . Food insecurity - worry: Not on file  . Food insecurity - inability: Not on file  . Transportation needs - medical: Not on file  . Transportation needs - non-medical: Not on file  Occupational History  . Not on file  Tobacco Use  . Smoking status: Current Every Day Smoker    Types: Cigarettes  . Smokeless  tobacco: Never Used  Substance and Sexual Activity  . Alcohol use: Yes    Comment: has drank beer in past, not regularly  . Drug use: No  . Sexual activity: Yes    Birth control/protection: Condom  Other Topics Concern  . Not on file  Social History Narrative   Lives at home with Mom, Dad, and Sister. 3 dogs. Mom & Dad smoke outside, not in front of kids.    Review of Systems  Constitutional: Positive for chills, fatigue and fever. Negative for unexpected weight change.  HENT: Negative for congestion, mouth sores, sinus pressure, sinus pain, sneezing, sore throat, trouble swallowing and voice change.   Eyes: Negative for visual disturbance.  Respiratory: Negative for cough, choking, chest tightness, shortness of breath and stridor.   Cardiovascular: Negative for chest pain, palpitations and leg swelling.    Gastrointestinal: Positive for abdominal pain, nausea and vomiting. Negative for constipation and diarrhea.  Genitourinary: Positive for dysuria. Negative for decreased urine volume, difficulty urinating, discharge, frequency, genital sores, hematuria, penile pain, scrotal swelling, testicular pain and urgency.  Skin: Negative for rash.  Neurological: Negative for dizziness, weakness and numbness.  Hematological: Negative for adenopathy. Does not bruise/bleed easily.  Psychiatric/Behavioral: Negative for agitation and dysphoric mood.     Objective:   BP 128/70 (BP Location: Left Arm, Patient Position: Sitting, Cuff Size: Normal)   Pulse 97   Temp 99.3 F (37.4 C) (Oral)   Resp 16   Wt 145 lb (65.8 kg)   SpO2 98%   BMI 23.40 kg/m   Physical Exam  Constitutional: He is oriented to person, place, and time. He appears well-developed and well-nourished. No distress.  HENT:  Head: Normocephalic and atraumatic.  Right Ear: External ear normal.  Left Ear: External ear normal.  Nose: Nose normal.  Mouth/Throat: Oropharynx is clear and moist. No oropharyngeal exudate.  Eyes: Conjunctivae and EOM are normal. Pupils are equal, round, and reactive to light. No scleral icterus.  Neck: Normal range of motion. Neck supple.  Shotty submandibular lymphadenopathy present.  Nontender to palpation.  Cardiovascular: Normal rate, regular rhythm and normal heart sounds.  Pulmonary/Chest: Effort normal and breath sounds normal. No stridor. No respiratory distress. He has no wheezes.  Abdominal: Soft. Bowel sounds are normal.  Patient does have some right upper and right lower quadrant tenderness to palpation.  The edge of the spleen is palpable.  No hepatomegaly present.  Negative Murphy sign.  Positive bowel sounds.  No rebound present.  Genitourinary:  Genitourinary Comments: Normal male genitalia.  No inguinal lymphadenopathy present.  Musculoskeletal: Normal range of motion.  Neurological: He is  alert and oriented to person, place, and time.  Skin: Skin is warm and dry. Capillary refill takes less than 2 seconds. He is not diaphoretic.  Psychiatric: He has a normal mood and affect. His behavior is normal. Judgment and thought content normal.     Assessment and Plan   1. Dysuria Check for UTI and STI at this time.  - POCT urinalysis dipstick - Urine cytology ancillary only - Urine Culture  2. Screening examination for STD (sexually transmitted disease) Screening performed.  - HIV antibody (with reflex)  3. Fever, unspecified fever cause Check labs.  - Hepatitis panel, acute  4. Splenomegaly Spoke with Dr.Mark Myra Gianotti on the phone today regarding patient CT scan of the abdomen and pelvis.  We discussed that his spleen is approximately 918 cm.  This is approximately a little over 2 times average size.  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  They did not believe this was a result of trauma.  He believes that due to the enlargement of the spleen that the infarction is secondary to lack of blood flow to those areas.  We reviewed the CT scan in detail.  There are no acute findings on the CT scan specifically no gallbladder, liver, appendix or bowel abnormalities.  There were no abscesses noted.  He did have a small amount of free fluid that was present in the pelvis which was thought possibly secondary to the splenomegaly.  Patient does not have any left upper quadrant tenderness.  Results of CT scan were discussed with patient and his mother in detail today at the office visit.  We did discuss that at this point will rule out patient for mononucleosis which could be the cause of the splenomegaly.  Will check blood counts and blood smear at this time.  We will also check electrolytes, liver function, HIV, and hepatitis profile.  We did discuss that splenomegaly does increase his risk of splenic rupture.  He was counseled not to engage  in any contact sports or behavior that would put him at risk for trauma to the abdomen.  He voiced understanding.  Did discuss that his nausea and vomiting could be a result of the enlarged spleen causing pressure on his stomach.  He will continue to drink small amounts of fluid on a very frequent basis and continue the diet as we have discussed.  Patient and mom were counseled concerning worrisome signs and symptoms and told if those develop to please go to the emergency department or contact help.  Will await lab test and then I will contact family.  In the meantime, if there are any changes in his condition or if they have any questions or concerns they will contact me.  Patient will continue to use Tylenol or Advil over-the-counter as directed for his fevers.  Still suspect at this time the patient's fevers are secondary to viral etiology.  He will go ahead and continue the Cipro as previously described.  Still waiting on stool cultures.   - CBC with Differential/Platelet - Epstein-Barr virus VCA antibody panel - Monospot - COMPLETE METABOLIC PANEL WITH GFR  Return if symptoms worsen or fail to improve. Aliene Beams, MD 08/18/2017

## 2017-08-19 ENCOUNTER — Telehealth: Payer: Self-pay | Admitting: Family Medicine

## 2017-08-19 ENCOUNTER — Other Ambulatory Visit (HOSPITAL_COMMUNITY)
Admission: RE | Admit: 2017-08-19 | Discharge: 2017-08-19 | Disposition: A | Discharge status: Home / Self Care | Payer: 59 | Source: Other Acute Inpatient Hospital | Attending: Family Medicine | Admitting: Family Medicine

## 2017-08-19 DIAGNOSIS — R300 Dysuria: Secondary | ICD-10-CM | POA: Insufficient documentation

## 2017-08-19 LAB — COMPLETE METABOLIC PANEL WITH GFR
AG Ratio: 1.7 (calc) (ref 1.0–2.5)
ALT: 27 U/L (ref 8–46)
AST: 28 U/L (ref 12–32)
Albumin: 4.3 g/dL (ref 3.6–5.1)
Alkaline phosphatase (APISO): 82 U/L (ref 48–230)
BUN: 8 mg/dL (ref 7–20)
CALCIUM: 9.3 mg/dL (ref 8.9–10.4)
CO2: 27 mmol/L (ref 20–32)
CREATININE: 0.94 mg/dL (ref 0.60–1.26)
Chloride: 102 mmol/L (ref 98–110)
GFR, EST NON AFRICAN AMERICAN: 117 mL/min/{1.73_m2} (ref >=60)
GFR, Est African American: 136 mL/min/{1.73_m2} (ref >=60)
GLOBULIN: 2.6 g/dL (ref 2.1–3.5)
GLUCOSE: 97 mg/dL (ref 65–139)
Potassium: 4.4 mmol/L (ref 3.8–5.1)
SODIUM: 137 mmol/L (ref 135–146)
Total Bilirubin: 0.6 mg/dL (ref 0.2–1.1)
Total Protein: 6.9 g/dL (ref 6.3–8.2)

## 2017-08-19 LAB — EPSTEIN-BARR VIRUS VCA ANTIBODY PANEL: EBV NA IgG: <18 U/mL

## 2017-08-19 LAB — HIV ANTIBODY (ROUTINE TESTING W REFLEX): HIV 1&2 Ab, 4th Generation: NONREACTIVE

## 2017-08-19 LAB — CBC WITH DIFFERENTIAL/PLATELET
BASOS ABS: 101 {cells}/uL (ref 0–200)
Basophils Relative: 1.5 %
EOS PCT: 0.1 %
Eosinophils Absolute: 7 cells/uL — ABNORMAL LOW (ref 15–500)
HCT: 39.9 % (ref 38.5–50.0)
HEMOGLOBIN: 13.8 g/dL (ref 13.2–17.1)
LYMPHS ABS: 3980 {cells}/uL — AB (ref 850–3900)
MCH: 30.2 pg (ref 27.0–33.0)
MCHC: 34.6 g/dL (ref 32.0–36.0)
MCV: 87.3 fL (ref 80.0–100.0)
MONOS PCT: 6.3 %
MPV: 10.8 fL (ref 7.5–12.5)
NEUTROS ABS: 2191 {cells}/uL (ref 1500–7800)
Neutrophils Relative %: 32.7 %
Platelets: 243 10*3/uL (ref 140–400)
RBC: 4.57 10*6/uL (ref 4.20–5.80)
RDW: 12.7 % (ref 11.0–15.0)
Total Lymphocyte: 59.4 %
WBC mixed population: 422 cells/uL (ref 200–950)
WBC: 6.7 10*3/uL (ref 3.8–10.8)

## 2017-08-19 LAB — HEPATITIS PANEL, ACUTE
HEP A IGM: NONREACTIVE
HEP B C IGM: NONREACTIVE
HEP B S AG: NONREACTIVE
Hepatitis C Ab: NONREACTIVE
SIGNAL TO CUT-OFF: 0.01 (ref <1.00)

## 2017-08-19 LAB — URINE CYTOLOGY ANCILLARY ONLY
Chlamydia: NEGATIVE
NEISSERIA GONORRHEA: NEGATIVE
TRICH (WINDOWPATH): NEGATIVE

## 2017-08-19 LAB — MONONUCLEOSIS SCREEN: HETEROPHILE, MONO SCREEN: POSITIVE — AB

## 2017-08-19 MED ORDER — PROMETHAZINE HCL 25 MG PO TABS: ORAL_TABLET | ORAL | 0 refills | Status: DC

## 2017-08-19 NOTE — Telephone Encounter (Signed)
Per Dr. Tracie HarrierHagler, promethazine sent to pharmacy for patient.

## 2017-08-20 LAB — STOOL CULTURE
MICRO NUMBER: 90218322
MICRO NUMBER:: 90218320
MICRO NUMBER:: 90218321
SHIGA RESULT: NOT DETECTED
SPECIMEN QUALITY: ADEQUATE
SPECIMEN QUALITY:: ADEQUATE
SPECIMEN QUALITY:: ADEQUATE

## 2017-08-20 LAB — URINE CULTURE: Culture: <10000 — AB

## 2017-08-31 ENCOUNTER — Ambulatory Visit (INDEPENDENT_AMBULATORY_CARE_PROVIDER_SITE_OTHER): Payer: 59 | Admitting: Family Medicine

## 2017-08-31 ENCOUNTER — Encounter: Payer: Self-pay | Admitting: Family Medicine

## 2017-08-31 VITALS — BP 124/72 | HR 104 | Temp 97.8°F | Resp 16 | Ht 64.0 in | Wt 148.2 lb

## 2017-08-31 DIAGNOSIS — R1111 Vomiting without nausea: Secondary | ICD-10-CM

## 2017-08-31 DIAGNOSIS — R161 Splenomegaly, not elsewhere classified: Secondary | ICD-10-CM

## 2017-08-31 DIAGNOSIS — B2790 Infectious mononucleosis, unspecified without complication: Secondary | ICD-10-CM | POA: Diagnosis not present

## 2017-08-31 LAB — BASIC METABOLIC PANEL
BUN: 13 mg/dL (ref 7–20)
CO2: 27 mmol/L (ref 20–32)
Calcium: 10 mg/dL (ref 8.9–10.4)
Chloride: 103 mmol/L (ref 98–110)
Creat: 0.79 mg/dL (ref 0.60–1.26)
GLUCOSE: 90 mg/dL (ref 65–139)
Potassium: 4.2 mmol/L (ref 3.8–5.1)
SODIUM: 138 mmol/L (ref 135–146)

## 2017-08-31 NOTE — Progress Notes (Signed)
Patient ID: Ricardo Stevens, male    DOB: [birth year], 22 y.o.   MRN: [id number]  Chief Complaint  Patient presents with  . Nausea    Allergies Patient has no known allergies.  Subjective:   Ricardo Stevens is a 22 y.o. male who presents to [facility] today.  HPI Here for follow up of mononucleosis/EBV resultant splenomegaly.  His course has been significant for fevers and persistent vomiting.  He is following up today for a recheck.  He is still having emesis after eating but not associated with every meal. Has been drinking well.  No associated emesis with liquids.  He reports that emesis occurs after eating, small amount of emesis.  The emesis with food intake is not the whole volume of his food.  Reports that is eating twice a day, normal amount. Reports that can just feel like is going to vomit and it just hits and vomits. Has had some looser stool but no bouts of diarrhea.  Denies any hematemesis, associated abdominal pain, bilious emesis. No melena. No fevers. Has had the lymph nodes in neck go down.  Is overall feeling better but not back to his normal state.  His girlfriend is present with him today and reports that he has been doing better.  He is getting out and doing more things.  He is not participating in any contact sports or doing exercise due to his recent illness and enlarged spleen.  He is not having any more right-sided upper  quadrant pain.  He has not had any jaundice.    Past Medical History:  Diagnosis Date  . ADHD (attention deficit hyperactivity disorder)   . Asthma   . Concussion   . Head injury, acute, without loss of consciousness     Past Surgical History:  Procedure Laterality Date  . ADENOIDECTOMY    . CIRCUMCISION    . TONSILLECTOMY    . TYMPANOSTOMY TUBE PLACEMENT Bilateral     Family History  Problem Relation Age of Onset  . Hypertension Mother   . Diabetes Maternal Grandmother   . Diabetes Maternal Grandfather   . Diabetes Paternal  Grandmother   . Breast cancer Paternal Grandmother   . Diabetes Paternal Grandfather      Social History   Socioeconomic History  . Marital status: Single    Spouse name: None  . Number of children: None  . Years of education: None  . Highest education level: None  Social Needs  . Financial resource strain: None  . Food insecurity - worry: None  . Food insecurity - inability: None  . Transportation needs - medical: None  . Transportation needs - non-medical: None  Occupational History  . None  Tobacco Use  . Smoking status: Current Every Day Smoker    Types: Cigarettes  . Smokeless tobacco: Never Used  Substance and Sexual Activity  . Alcohol use: Yes    Comment: has drank beer in past, not regularly  . Drug use: No  . Sexual activity: Yes    Birth control/protection: Condom  Other Topics Concern  . None  Social History Narrative   Lives at home with Mom, Dad, and Sister. 3 dogs. Mom & Dad smoke outside, not in front of kids.    Review of Systems  Constitutional: Positive for fatigue. Negative for chills, fever and unexpected weight change.  HENT: Negative for congestion, sinus pain, sore throat, trouble swallowing and voice change.   Respiratory: Negative for cough, chest  tightness, shortness of breath, wheezing and stridor.   Cardiovascular: Negative for chest pain, palpitations and leg swelling.  Gastrointestinal: Positive for nausea and vomiting. Negative for abdominal pain, blood in stool, constipation, diarrhea and rectal pain.  Genitourinary: Negative for decreased urine volume, dysuria and urgency.  Musculoskeletal: Negative for back pain and joint swelling.  Skin: Negative for rash.  Neurological: Negative for dizziness, tremors, syncope, light-headedness and headaches.  Hematological: Negative for adenopathy. Does not bruise/bleed easily.  Psychiatric/Behavioral: Negative for dysphoric mood. The patient is not nervous/anxious.      Objective:   BP  124/72 (BP Location: Left Arm, Patient Position: Sitting, Cuff Size: Normal)   Pulse (!) 104   Temp 97.8 F (36.6 C) (Temporal)   Resp 16   Ht 5\' 4"  (1.626 m)   Wt 148 lb 4 oz (67.2 kg)   SpO2 98%   BMI 25.45 kg/m   Physical Exam  Constitutional: He is oriented to person, place, and time. He appears well-developed and well-nourished.  HENT:  Head: Normocephalic and atraumatic.  Mouth/Throat: Uvula is midline, oropharynx is clear and moist and mucous membranes are normal. Mucous membranes are not dry. No oropharyngeal exudate, posterior oropharyngeal edema or posterior oropharyngeal erythema. No tonsillar exudate.  No submandibular lymph nodes palpable.  No anterior cervical lymph nodes palpable.  Neck nontender with palpation.  Eyes: EOM are normal. Pupils are equal, round, and reactive to light.  Neck: Trachea normal and normal range of motion. Neck supple. No neck rigidity. No edema and normal range of motion present. No thyromegaly present.  Cardiovascular: Normal rate, regular rhythm and normal heart sounds.  Pulses:      Dorsalis pedis pulses are 2+ on the right side, and 2+ on the left side.  Pulmonary/Chest: Effort normal and breath sounds normal.  Abdominal: Soft. Bowel sounds are normal. He exhibits no ascites. There is splenomegaly. There is no hepatomegaly. There is no tenderness.  Spleen still enlarged.  Palpable approximately 2 cm below rib cage.  Musculoskeletal: He exhibits no edema.  Neurological: He is alert and oriented to person, place, and time. No cranial nerve deficit.  Skin: Skin is warm, dry and intact.  Psychiatric: He has a normal mood and affect. His behavior is normal. Thought content normal.  Vitals reviewed.    Assessment and Plan  1. Mononucleosis Patient course improving and expected as infection states should progress.  Continue good fluid intake. Munar use Tylenol or Advil over-the-counter as needed. Plan to check CBC in several months. 2.  Splenomegaly Patient was told to continue avoidance of contact sports, exercise, and physical activity that is exertional until follow-up.  Plan to repeat ultrasound in 1 month or sooner if indicated.  3. Intractable vomiting without nausea, unspecified vomiting type Patient still has persistent vomiting.  Discussed with patient at this time that I suspect that his volume of food at one sitting is greater than it should be due to his spleen size.  Recommend smaller meals, more frequent times per day.  Discussed portion size in detail.  Patient has not lost weight since his last visit.  He appears well hydrated.  We will check his electrolytes today.  - Basic metabolic panel Patient will call in 1 month to schedule the ultrasound.  Was told to call with any questions, concerns, or worrisome symptoms.  Reassurance and supportive care were discussed. Return in about 4 weeks (around 09/28/2017) for Follow-up. Aliene Beamsachel Vic Esco, MD 09/02/2017

## 2017-09-17 ENCOUNTER — Telehealth: Payer: Self-pay | Admitting: Family Medicine

## 2017-09-17 ENCOUNTER — Encounter: Payer: Self-pay | Admitting: Family Medicine

## 2017-09-17 DIAGNOSIS — R197 Diarrhea, unspecified: Principal | ICD-10-CM

## 2017-09-17 DIAGNOSIS — R112 Nausea with vomiting, unspecified: Secondary | ICD-10-CM

## 2017-09-17 DIAGNOSIS — R161 Splenomegaly, not elsewhere classified: Secondary | ICD-10-CM

## 2017-09-17 MED ORDER — PROMETHAZINE HCL 25 MG PO TABS: 25.0000 mg | ORAL_TABLET | Freq: Three times a day (TID) | ORAL | 0 refills | Status: DC | PRN

## 2017-09-17 NOTE — Telephone Encounter (Signed)
Received message from patient's mother stating that patient had started vomiting again.  She was requesting a refill on Phenergan.  Discussed this with patient's mother and told I I will go ahead and call in a prescription for Phenergan.    She reports that he is having diarrhea and vomiting which started this morning. Has been vomiting and believe it is making his stomach sore. We discussed his recent mono episode and I do not believe that the vomiting is related to that but at this point I am not sure why he is continuing to have vomiting.  I would like for him to go ahead and get a repeat abdominal ultrasound.  Pending the evaluation of the spleen he will either follow-up with hematology if it is still elevated or GI secondary to his continued vomiting.  We did discuss that this could be a VGE due to the new diarrhea. She counseled concerning worrisome signs and symptoms of vomiting and abdominal  and if it occurs to go to the emergency department.

## 2017-09-23 ENCOUNTER — Telehealth: Payer: Self-pay | Admitting: Family Medicine

## 2017-09-23 ENCOUNTER — Encounter: Payer: Self-pay | Admitting: Family Medicine

## 2017-09-23 ENCOUNTER — Ambulatory Visit (HOSPITAL_COMMUNITY)
Admission: RE | Admit: 2017-09-23 | Discharge: 2017-09-23 | Disposition: A | Discharge status: Home / Self Care | Payer: 59 | Source: Ambulatory Visit | Attending: Family Medicine | Admitting: Family Medicine

## 2017-09-23 DIAGNOSIS — R161 Splenomegaly, not elsewhere classified: Secondary | ICD-10-CM

## 2017-09-23 DIAGNOSIS — R197 Diarrhea, unspecified: Secondary | ICD-10-CM | POA: Insufficient documentation

## 2017-09-23 DIAGNOSIS — R112 Nausea with vomiting, unspecified: Secondary | ICD-10-CM | POA: Diagnosis not present

## 2017-09-23 NOTE — Telephone Encounter (Signed)
Ricardo Stevens called and said she would like you to call her when you have the scan results.  The tech told them his spleen is still enlarged. She is leaving out of town at 2:30 today.  Per Dr Tracie HarrierHagler I called Ricardo Stevens back and told her we would not have results by then and Dr Tracie HarrierHagler will contact her when the results come in. It Goin be Tuesday when she comes back in the office.  Ricardo Stevens if off Monday and that will be fine.

## 2017-09-23 NOTE — Telephone Encounter (Signed)
I called Betsy to discuss results of ultrasound.  There was no answer on the phone.  Please call her and advise that the ultrasound revealed:  Overall splenic size and contour currently within normal limits. Small accessory spleen noted. Not significant. No splenic infarct is appreciable currently by ultrasound.  Advise her that the ultrasound looks good and is back to normal. Good news. If he is having any persistent nausea, vomiting, or GI issues, I would like for him to be seen by GI.  Janine Limboachel H. Tracie HarrierHagler, MD

## 2017-09-26 ENCOUNTER — Ambulatory Visit (HOSPITAL_COMMUNITY): Payer: 59

## 2017-11-23 ENCOUNTER — Ambulatory Visit (INDEPENDENT_AMBULATORY_CARE_PROVIDER_SITE_OTHER): Payer: Self-pay | Admitting: Family Medicine

## 2017-11-23 VITALS — BP 120/75 | HR 95 | Temp 98.5°F | Resp 16 | Wt 156.8 lb

## 2017-11-23 DIAGNOSIS — T63441A Toxic effect of venom of bees, accidental (unintentional), initial encounter: Secondary | ICD-10-CM

## 2017-11-23 DIAGNOSIS — M79641 Pain in right hand: Secondary | ICD-10-CM

## 2017-11-23 MED ORDER — PREDNISONE 20 MG PO TABS: 60.0000 mg | ORAL_TABLET | Freq: Every day | ORAL | 0 refills | Status: AC

## 2017-11-23 NOTE — Patient Instructions (Signed)
Bee, Wasp, or [employer], Adult Bees, wasps, and hornets are part of a family of insects that can sting people. These stings can cause pain and inflammation, but they are usually not serious. However, some people [place] have an allergic reaction to a sting. This can cause the symptoms to be more severe. What increases the risk? You Ewton be at a greater risk of getting stung if you:  Provoke a stinging insect by swatting or disturbing it.  Wear strong-smelling soaps, deodorants, or body sprays.  Spend time outdoors near gardens with flowers or fruit trees or in clothes that expose skin.  Eat or drink outside.  What are the signs or symptoms? Common symptoms of this condition include:  A red lump in the skin that sometimes has a tiny hole in the center. In some cases, a stinger [place] be in the center of the wound.  Pain and itching at the sting site.  Redness and swelling around the sting site. If you have an allergic reaction (localized allergic reaction), the swelling and redness [place] spread out from the sting site. In some cases, this reaction can continue to develop over the next 24-48 hours.  In rare cases, a person Delp[place] have a severe allergic reaction (anaphylactic reaction) to a sting. Symptoms of an anaphylactic reaction Rone include:  Wheezing or difficulty breathing.  Raised, itchy, red patches on the skin (hives).  Nausea or vomiting.  Abdominal cramping.  Diarrhea.  Tightness in the chest or chest pain.  Dizziness or fainting.  Redness of the face (flushing).  Hoarse voice.  Swollen tongue, lips, or face.  How is this diagnosed? This condition is usually diagnosed based on your symptoms and medical history as well as a physical exam. You Stankiewicz have an allergy test to determine if you are allergic to the substance that the insect injected during the sting (venom). How is this treated? If you were stung by a bee, the stinger and a small sac of venom Woolf be in the wound.  It is important to remove the stinger as soon as possible. You can do this by brushing across the wound with gauze, a fingernail, or a flat card such as a credit card. Removing the stinger can help reduce the severity of your body's reaction to the sting. Most stings can be treated with:  Icing to reduce swelling in the area.  Medicines (antihistamines) to treat itching or an allergic reaction.  Medicines to help reduce pain. These Koppel be medicines that you take by mouth, or medicated creams or lotions that you apply to your skin.  Pay close attention to your symptoms after you have been stung. If possible, have someone stay with you to make sure you do not have an allergic reaction. If you have any signs of an allergic reaction, call your health care provider. If you have ever had a severe allergic reaction, your health care provider Winkels give you an inhaler or injectable medicine (epinephrine auto-injector) to use if necessary. Follow these instructions at home:  Wash the sting site 2-3 times each day with soap and water as told by your health care provider.  Apply or take over-the-counter and prescription medicines only as told by your health care provider.  If directed, apply ice to the sting area. ? Put ice in a plastic bag. ? Place a towel between your skin and the bag. ? Leave the ice on for 20 minutes, 2-3 times a day.  Do not scratch the sting  area.  If you had a severe allergic reaction to a sting, you Hamler need: ? To wear a medical bracelet or necklace that lists the allergy. ? To learn when and how to use an anaphylaxis kit or epinephrine injection. Your family members and coworkers Veasey also need to learn this. ? To carry an anaphylaxis kit or epinephrine injection with you at all times. How is this prevented?  Avoid swatting at stinging insects and disturbing insect nests.  Do not use fragrant soaps or lotions.  Wear shoes, pants, and long sleeves when spending time  outdoors, especially in grassy areas where stinging insects are common.  Keep outdoor areas free from nests or hives.  Keep food and drink containers covered when eating outdoors.  Avoid working or sitting near flowering plants, if possible.  Wear gloves if you are gardening or working outdoors.  If an attack by a stinging insect or a swarm seems likely in the moment, move away from the area or find a barrier between you and the insect(s), such as a door. Contact a health care provider if:  Your symptoms do not get better in 2-3 days.  You have redness, swelling, or pain that spreads beyond the area of the sting.  You have a fever. Get help right away if: You have symptoms of a severe allergic reaction. These include:  Wheezing or difficulty breathing.  Tightness in the chest or chest pain.  Light-headedness or fainting.  Itchy, raised, red patches on the skin.  Nausea or vomiting.  Abdominal cramping.  Diarrhea.  A swollen tongue or lips, or trouble swallowing.  Dizziness or fainting.  Summary  Stings from bees, wasps, and hornets can cause pain and inflammation, but they are usually not serious. However, some people Col[place]ito have an allergic reaction to a sting. This can cause the symptoms to be more severe.  Pay close attention to your symptoms after you have been stung. If possible, have someone stay with you to make sure you do not have an allergic reaction.  Call your health care provider if you have any signs of an allergic reaction. This information is not intended to replace advice given to you by your health care provider. Make sure you discuss any questions you have with your health care provider. Document Released: 06/14/2005 Document Revised: 08/19/2016 Document Reviewed: 08/19/2016 Elsevier Interactive Patient Education  2018 [employer]  

## 2017-11-23 NOTE — Progress Notes (Signed)
Ricardo Stevens is a 22 y.o. male who presents today with concerns of right hand swelling after being stung by a bee yesterday. He was painting a bridge at work when he reach over and was stung. He denies any rash or shortness of breath and outside of local pain in the area he reports no symptoms until waking up this morning with a swollen hand.  Review of Systems  Constitutional: Negative for chills, fever and malaise/fatigue.  HENT: Negative for congestion, ear discharge, ear pain, sinus pain and sore throat.   Eyes: Negative.   Respiratory: Negative for cough, sputum production and shortness of breath.   Cardiovascular: Negative.  Negative for chest pain.  Gastrointestinal: Negative for abdominal pain, diarrhea, nausea and vomiting.  Genitourinary: Negative for dysuria, frequency, hematuria and urgency.  Musculoskeletal: Negative for myalgias.       Right hand pain  Skin: Negative.   Neurological: Negative for headaches.  Endo/Heme/Allergies: Negative.   Psychiatric/Behavioral: Negative.     O: Vitals:   11/23/17 1844  BP: 120/75  Pulse: 95  Resp: 16  Temp: 98.5 F (36.9 C)  SpO2: 98%     Physical Exam  Constitutional: He is oriented to person, place, and time. Vital signs are normal. He appears well-developed and well-nourished. He is active.  Non-toxic appearance. He does not have a sickly appearance.  HENT:  Head: Normocephalic.  Right Ear: Hearing, external ear and ear canal normal.  Left Ear: Hearing, external ear and ear canal normal.  Nose: Nose normal.  Mouth/Throat: Uvula is midline and oropharynx is clear and moist.  Neck: Normal range of motion. Neck supple.  Cardiovascular: Normal rate, regular rhythm, normal heart sounds and normal pulses.  Pulmonary/Chest: Effort normal and breath sounds normal.  Abdominal: Soft. Bowel sounds are normal.  Musculoskeletal: Normal range of motion.  Lymphadenopathy:       Head (right side): No submental and no submandibular  adenopathy present.       Head (left side): No submental and no submandibular adenopathy present.    He has no cervical adenopathy.  Neurological: He is alert and oriented to person, place, and time.  Skin: Skin is warm. There is erythema.     Right hand large and inflammed (edema only limited to dorsal surface and not extended to fingers)- not weeping cappillary refill intact- full ROM- warm to touch and mildly erythemic. Papule below thumb at patient reported point of injury.  Psychiatric: He has a normal mood and affect.  Vitals reviewed.    A: 1. Bee sting, accidental or unintentional, initial encounter   2. Hand pain, right      P: Exam findings, diagnosis etiology and medication use and indications reviewed with patient. Follow- Up and discharge instructions provided. No emergent/urgent issues found on exam.  Patient verbalized understanding of information provided and agrees with plan of care (POC), all questions answered.  1. Bee sting, accidental or unintentional, initial encounter - predniSONE (DELTASONE) 20 MG tablet; Take 3 tablets (60 mg total) by mouth daily with breakfast for 5 days.  2. Hand pain, right

## 2017-12-02 ENCOUNTER — Encounter: Payer: Self-pay | Admitting: Family Medicine

## 2018-01-31 ENCOUNTER — Ambulatory Visit (INDEPENDENT_AMBULATORY_CARE_PROVIDER_SITE_OTHER): Payer: 59 | Admitting: Family Medicine

## 2018-01-31 ENCOUNTER — Encounter: Payer: Self-pay | Admitting: Family Medicine

## 2018-01-31 ENCOUNTER — Other Ambulatory Visit: Payer: Self-pay

## 2018-01-31 VITALS — BP 122/70 | HR 65 | Temp 98.8°F | Resp 12 | Ht 65.0 in | Wt 161.1 lb

## 2018-01-31 DIAGNOSIS — B880 Other acariasis: Secondary | ICD-10-CM | POA: Diagnosis not present

## 2018-01-31 DIAGNOSIS — R1111 Vomiting without nausea: Secondary | ICD-10-CM

## 2018-01-31 MED ORDER — PREDNISONE 20 MG PO TABS: ORAL_TABLET | ORAL | 0 refills | Status: DC

## 2018-01-31 MED ORDER — TRIAMCINOLONE ACETONIDE 0.1 % EX CREA: 1.0000 "application " | TOPICAL_CREAM | Freq: Four times a day (QID) | CUTANEOUS | 0 refills | Status: DC | PRN

## 2018-01-31 MED ORDER — HYDROXYZINE HCL 25 MG PO TABS: 25.0000 mg | ORAL_TABLET | Freq: Three times a day (TID) | ORAL | 0 refills | Status: DC | PRN

## 2018-01-31 NOTE — Patient Instructions (Addendum)
The hydroxyzine is for itching, but it can make you sleepy.  Start the steroids/prednisone today, take with food. Use the cream sparingly and not on face/neck.   You need to make sure you see the GI doctor.

## 2018-01-31 NOTE — Progress Notes (Signed)
Patient ID: Ricardo Stevens, male    DOB: Dec 31, 1995, 22 y.o.   MRN: [id number]  Chief Complaint  Patient presents with  . insect bites    lower legs, right side. putting up deer camera    Allergies Patient has no known allergies.  Subjective:   Ricardo Stevens is a 22 y.o. male who presents to [facility] today.  HPI Ricardo FearingJames presents for an acute visit today for itchy rash on his legs and some areas on his arms/back.  He reports that he was out in the woods putting up his deer camera several days ago and when he came home he was itching terribly.  He has had trigger bites in the past and they are similar to this.  He does not have bites where his socks were but above his socks he does have bites.  He reports he was unable to sleep last night at all because of the itching.  He has been scratching the areas.  He has not taken any medications or used any creams.  He denies any bites or itching along the webs of his fingers or in his genital area.  Denies any fever, chills, shortness of breath, swelling in his oral cavity.  Is not wheezing.  While he is here today for his visit he mentions that ever since he was diagnosed with mono back in February that he is continued to have episodes of vomiting.  He reports that he vomits about 5-10 minutes after eating.  Reports that the vomitus is consistent with food that he is just eaten.  He reports that he does not have any nausea prior to the vomiting.  He denies any abdominal pain with the incidents or after the vomiting.  He reports no pain at all.  He is able to swallow fine. Does not vomit liquids.  Denies any dyspepsia.  Has not had weight loss.  Energy is good.  Reports that he eats fine and then 10 minutes or so after eating he feels the urge to vomit and he throws up all his food.  Bowel movements are normal.  Denies any melena or bright red blood per rectum.  No hematemesis.  No rectal bleeding.  He reports that he brings this up today because  his girlfriend has been saying that he needs to get checked out and this is just not normal.  He denies any reflux.  No other complaints today. Emesis   This is a chronic problem. The current episode started more than 1 month ago. The problem occurs 2 to 4 times per day. The problem has been unchanged. The emesis has an appearance of stomach contents. There has been no fever. Pertinent negatives include no abdominal pain, arthralgias, chest pain, chills, coughing, diarrhea, dizziness, fever, headaches, myalgias, sweats, URI or weight loss.  Nothing seems to make the emesis not occur.  It occurs no matter what type of foods he eats.  It does not occur if he does not eat.  He does not have this with the liquids.  Past Medical History:  Diagnosis Date  . ADHD (attention deficit hyperactivity disorder)   . Asthma   . Concussion   . Head injury, acute, without loss of consciousness     Past Surgical History:  Procedure Laterality Date  . ADENOIDECTOMY    . CIRCUMCISION    . TONSILLECTOMY    . TYMPANOSTOMY TUBE PLACEMENT Bilateral     Family History  Problem Relation Age  of Onset  . Hypertension Mother   . Diabetes Maternal Grandmother   . Diabetes Maternal Grandfather   . Diabetes Paternal Grandmother   . Breast cancer Paternal Grandmother   . Diabetes Paternal Grandfather      Social History   Socioeconomic History  . Marital status: Single    Spouse name: Not on file  . Number of children: Not on file  . Years of education: Not on file  . Highest education level: Not on file  Occupational History  . Not on file  Social Needs  . Financial resource strain: Not on file  . Food insecurity:    Worry: Not on file    Inability: Not on file  . Transportation needs:    Medical: Not on file    Non-medical: Not on file  Tobacco Use  . Smoking status: Former Smoker    Types: Cigarettes  . Smokeless tobacco: Never Used  Substance and Sexual Activity  . Alcohol use: Yes     Comment: has drank beer in past, not regularly  . Drug use: No  . Sexual activity: Yes    Birth control/protection: Condom  Lifestyle  . Physical activity:    Days per week: Not on file    Minutes per session: Not on file  . Stress: Not on file  Relationships  . Social connections:    Talks on phone: Not on file    Gets together: Not on file    Attends religious service: Not on file    Active member of club or organization: Not on file    Attends meetings of clubs or organizations: Not on file    Relationship status: Not on file  Other Topics Concern  . Not on file  Social History Narrative   Lives at home with Mom, Dad, and Sister. 3 dogs. Mom & Dad smoke outside, not in front of kids.    Review of Systems  Constitutional: Negative for chills, fever and weight loss.  Respiratory: Negative for cough.   Cardiovascular: Negative for chest pain.  Gastrointestinal: Positive for vomiting. Negative for abdominal pain and diarrhea.  Musculoskeletal: Negative for arthralgias and myalgias.  Neurological: Negative for dizziness and headaches.     Objective:   BP 122/70 (BP Location: Left Arm, Patient Position: Sitting, Cuff Size: Large)   Pulse 65   Temp 98.8 F (37.1 C) (Temporal)   Resp 12   Ht 5\' 5"  (1.651 m)   Wt 161 lb 1.9 oz (73.1 kg)   SpO2 98% Comment: room air  BMI 26.81 kg/m   Physical Exam  Constitutional: He is oriented to person, place, and time. He appears well-developed and well-nourished. No distress.  HENT:  Head: Normocephalic and atraumatic.  Mouth/Throat: Oropharynx is clear and moist.  Eyes: Pupils are equal, round, and reactive to light. Conjunctivae and EOM are normal. No scleral icterus.  Neck: Normal range of motion. Neck supple.  Cardiovascular: Normal rate, regular rhythm, normal heart sounds and intact distal pulses.  No murmur heard. Pulmonary/Chest: Effort normal and breath sounds normal.  Abdominal: Soft. Bowel sounds are normal. He  exhibits no distension. There is no tenderness. There is no rebound and no guarding.  Neurological: He is alert and oriented to person, place, and time. No cranial nerve deficit.  Skin: Skin is warm and dry. Capillary refill takes less than 2 seconds. Rash noted. He is not diaphoretic.  Greater than 40, 2 to 4 mm erythematous papules scattered on lower extremities bilaterally.  No associated edema.  No vesicular lesions.  Scattered similar areas along the arm area which was exposed when patient was in the woods.  Psychiatric: He has a normal mood and affect. His behavior is normal. Judgment and thought content normal.  Vitals reviewed.    Assessment and Plan  1. Chigger bites Patient with intense pruritic reaction to chigger bites.  Patient was counseled regarding prevention of bites in the future and skin care precautions.  He was counseled regarding appropriate clothing and sprays that can be used for prevention.  He defers steroid injection today.  Will treat with steroid for 5 days.  He was counseled regarding the risk versus benefits of this medication.  He was told to take this with food.  Given prescription for steroid cream to use sparingly to areas if needed.  He was counseled on the risk versus benefits of this cream.  He was told not to apply to face or neck area due to risk of skin pigmentation issues.  Also given prescription for Atarax as needed for itching.  Sedation precautions given. - hydrOXYzine (ATARAX/VISTARIL) 25 MG tablet; Take 1 tablet (25 mg total) by mouth every 8 (eight) hours as needed for itching.  Dispense: 30 tablet; Refill: 0 - predniSONE (DELTASONE) 20 MG tablet; Take two pills a days for the next five days  Dispense: 10 tablet; Refill: 0 - triamcinolone cream (KENALOG) 0.1 %; Apply 1 application topically 4 (four) times daily as needed.  Dispense: 45 g; Refill: 0 Patient counseled in detail regarding the risks of medication. Told to call or return to clinic if develop  any worrisome signs or symptoms. Patient voiced understanding.   2. Vomiting without nausea, intractability of vomiting not specified, unspecified vomiting type Uncertain etiology due to the fact the patient has no pain, reflux symptoms and is been going on for quite some time.  He has had an ultrasound of his abdomen performed after his mono which did reveal resolution of spleen back to normal size.  Patient was counseled regarding worrisome signs and symptoms of vomiting and if it occurs he should go to the emergency department.  He appears well-hydrated today and this has been a chronic issue.  His weight is actually increased since he was diagnosed with mono.  Patient denies any anxiety and I believe that referral to gastroenterology is indicated.  Possibly needs evaluation to rule out gastric outlet obstruction/stenosis.  Referral placed today. - Ambulatory referral to Gastroenterology  No follow-ups on file.  Patient will follow-up with new PCP office for CPE.  Call with any questions or concerns. Aliene Beams, MD 02/01/2018

## 2018-02-01 ENCOUNTER — Encounter: Payer: Self-pay | Admitting: Family Medicine

## 2018-02-06 ENCOUNTER — Encounter: Payer: Self-pay | Admitting: Gastroenterology

## 2018-03-01 ENCOUNTER — Telehealth: Payer: Self-pay | Admitting: Family Medicine

## 2018-03-01 DIAGNOSIS — B880 Other acariasis: Secondary | ICD-10-CM

## 2018-03-01 MED ORDER — PREDNISONE 20 MG PO TABS: ORAL_TABLET | ORAL | 0 refills | Status: DC

## 2018-03-01 NOTE — Telephone Encounter (Signed)
Spoke with patient's mother and chigger bites are bothering him tremendously. Cannot take off work to come in and be seen. Bites are keeping him up at night and he is scratching them. Would like script for steroids. Understands. Patient's mother and patient understand the risks of medication. Told to call or return to clinic if develop any worrisome signs or symptoms.voiced understanding. Called into pharmacy. Informed it was called in.

## 2018-03-01 NOTE — Telephone Encounter (Signed)
Pt has been exposed to Chiggers again, and is broke out, please call the pt in some prednisone.

## 2018-03-03 ENCOUNTER — Telehealth: Payer: 59 | Admitting: Family

## 2018-03-03 DIAGNOSIS — J029 Acute pharyngitis, unspecified: Secondary | ICD-10-CM

## 2018-03-03 MED ORDER — BENZONATATE 100 MG PO CAPS: 100.0000 mg | ORAL_CAPSULE | Freq: Three times a day (TID) | ORAL | 0 refills | Status: DC | PRN

## 2018-03-03 NOTE — Progress Notes (Signed)
Thank you for the details you included in the comment boxes. Those details are very helpful in determining the best course of treatment for you and help us to provide the best care.  We are sorry that you are not feeling well.  Here is how we plan to help!  Based on your presentation I believe you most likely have A cough due to a virus.  This is called viral bronchitis and is best treated by rest, plenty of fluids and control of the cough.  You Seefeldt use Ibuprofen or Tylenol as directed to help your symptoms.     In addition you Reddy use A non-prescription cough medication called Mucinex DM: take 2 tablets every 12 hours. and A prescription cough medication called Tessalon Perles 100mg. You Manternach take 1-2 capsules every 8 hours as needed for your cough.    From your responses in the eVisit questionnaire you describe inflammation in the upper respiratory tract which is causing a significant cough.  This is commonly called Bronchitis and has four common causes:    Allergies  Viral Infections  Acid Reflux  Bacterial Infection Allergies, viruses and acid reflux are treated by controlling symptoms or eliminating the cause. An example might be a cough caused by taking certain blood pressure medications. You stop the cough by changing the medication. Another example might be a cough caused by acid reflux. Controlling the reflux helps control the cough.  USE OF BRONCHODILATOR ("RESCUE") INHALERS: There is a risk from using your bronchodilator too frequently.  The risk is that over-reliance on a medication which only relaxes the muscles surrounding the breathing tubes can reduce the effectiveness of medications prescribed to reduce swelling and congestion of the tubes themselves.  Although you feel brief relief from the bronchodilator inhaler, your asthma Dier actually be worsening with the tubes becoming more swollen and filled with mucus.  This can delay other crucial treatments, such as oral steroid  medications. If you need to use a bronchodilator inhaler daily, several times per day, you should discuss this with your provider.  There are probably better treatments that could be used to keep your asthma under control.     HOME CARE . Only take medications as instructed by your medical team. . Complete the entire course of an antibiotic. . Drink plenty of fluids and get plenty of rest. . Avoid close contacts especially the very young and the elderly . Cover your mouth if you cough or cough into your sleeve. . Always remember to wash your hands . A steam or ultrasonic humidifier can help congestion.   GET HELP RIGHT AWAY IF: . You develop worsening fever. . You become short of breath . You cough up blood. . Your symptoms persist after you have completed your treatment plan MAKE SURE YOU   Understand these instructions.  Will watch your condition.  Will get help right away if you are not doing well or get worse.  Your e-visit answers were reviewed by a board certified advanced clinical practitioner to complete your personal care plan.  Depending on the condition, your plan could have included both over the counter or prescription medications. If there is a problem please reply  once you have received a response from your provider. Your safety is important to us.  If you have drug allergies check your prescription carefully.    You can use MyChart to ask questions about today's visit, request a non-urgent call back, or ask for a work or school excuse   for 24 hours related to this e-Visit. If it has been greater than 24 hours you will need to follow up with your provider, or enter a new e-Visit to address those concerns. You will get an e-mail in the next two days asking about your experience.  I hope that your e-visit has been valuable and will speed your recovery. Thank you for using e-visits.   

## 2018-03-31 DIAGNOSIS — F419 Anxiety disorder, unspecified: Secondary | ICD-10-CM | POA: Diagnosis not present

## 2018-05-12 ENCOUNTER — Telehealth: Payer: Self-pay

## 2018-05-12 ENCOUNTER — Encounter: Payer: Self-pay | Admitting: Gastroenterology

## 2018-05-12 ENCOUNTER — Ambulatory Visit: Payer: 59 | Admitting: Gastroenterology

## 2018-05-12 ENCOUNTER — Other Ambulatory Visit: Payer: Self-pay

## 2018-05-12 VITALS — BP 135/81 | HR 66 | Temp 97.8°F | Ht 66.0 in | Wt 174.8 lb

## 2018-05-12 DIAGNOSIS — R1110 Vomiting, unspecified: Secondary | ICD-10-CM | POA: Insufficient documentation

## 2018-05-12 DIAGNOSIS — R112 Nausea with vomiting, unspecified: Secondary | ICD-10-CM | POA: Diagnosis not present

## 2018-05-12 DIAGNOSIS — R1013 Epigastric pain: Secondary | ICD-10-CM | POA: Diagnosis not present

## 2018-05-12 HISTORY — DX: Vomiting, unspecified: R11.10

## 2018-05-12 HISTORY — DX: Epigastric pain: R10.13

## 2018-05-12 MED ORDER — PANTOPRAZOLE SODIUM 40 MG PO TBEC: 40.0000 mg | DELAYED_RELEASE_TABLET | Freq: Every day | ORAL | 1 refills | Status: DC

## 2018-05-12 NOTE — Assessment & Plan Note (Signed)
Very pleasant 22 year old male with history of mononucleosis complicated by splenomegaly (documented resolution in March) earlier this year, presenting for further evaluation chronic postprandial upper abdominal pain associated with vomiting.  He believes his symptoms were present prior to the diagnosis of mono.  Symptoms definitely worsened thereafter.  He maintains a good appetite.  No matter what he eats or how much he eats he would develop almost immediate vomiting.  He has been able to maintain his weight, actually has gained 20 pounds in the setting of steroids which he took for chiggers back in August.  Denies any typical reflux symptoms.  Gallbladder unremarkable appearing on CT and ultrasound.  Differential diagnosis includes possibility of biliary etiology, atypical reflux, gastritis, alpha gal allergy.  Less likely peptic ulcer disease.  Consider celiac disease.  Would recommend upper endoscopy with Dr. Darrick PennaFields.  I have discussed the risks, alternatives, benefits with regards to but not limited to the risk of reaction to medication, bleeding, infection, perforation and the patient is agreeable to proceed. Written consent to be obtained.  Start pantoprazole 40 mg 30 minutes before breakfast daily.  Update labs, check alpha gal panel, celiac screen as well.

## 2018-05-12 NOTE — Patient Instructions (Signed)
1. Go to [facility] for your labs. We will contact you with results as available.  2. Upper endoscopy as scheduled. See separate instructions.  3. Start pantoprazole once daily 30 minutes before breakfast. RX sent to your pharmacy.

## 2018-05-12 NOTE — Progress Notes (Addendum)
Primary Care Physician:  Aliene Beams, MD  Primary Gastroenterologist:  Jonette Eva, MD REVIEWED-NO ADDITIONAL RECOMMENDATIONS.  Chief Complaint  Patient presents with  . Nausea    w/ vomiting. Mostly after eating x 5 months    HPI:  Ricardo Stevens is a 22 y.o. male here for further evaluation of postprandial epigastric pain, nausea and vomiting at the request of Hagler. Patient presents with his mother today.   He has a history of chronic prandial upper abdominal pain associated with vomiting that is been occurring the past 10 months.  He was diagnosed with mono back in February.  At that time his symptoms seem to be worse.  Mono complicated by splenomegaly.  Follow-up ultrasound in March showed resolution of splenomegaly.  Patient states that he vomits almost every day.  Continues to have a good appetite.  It does not seem to matter what he eats or how much he eats.  He has had nocturnal vomiting.  He denies any significant heartburn.  Bowel function is normal.  No melena or rectal bleeding.  No weight loss, in fact he is gained about 20 pounds.  According to the patient when he was on prednisone for chiggers he gained most of the weight.   He is an avid Therapist, nutritional.  Spends a lot of time outdoors.  Father has alpha gal.  Abdomen pelvis with contrast August 18, 2017, spleen enlarged at 14 x 12.5 x 10.5 cm.  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  Splenomegaly be related to mononucleosis.  Abdominal ultrasound September 23, 2017 no gallstones.  Common bile duct 2 mm.  Spleen normal in size.   No current outpatient medications on file.   No current facility-administered medications for this visit.     Allergies as of 05/12/2018  . (No Known Allergies)    Past Medical History:  Diagnosis Date  . ADHD (attention deficit hyperactivity disorder)   . Asthma   . Concussion   . Head injury, acute, without loss of consciousness      Past Surgical History:  Procedure Laterality Date  . ADENOIDECTOMY    . CIRCUMCISION    . TONSILLECTOMY    . TYMPANOSTOMY TUBE PLACEMENT Bilateral     Family History  Problem Relation Age of Onset  . Hypertension Mother   . Diabetes Maternal Grandmother   . Diabetes Maternal Grandfather   . Diabetes Paternal Grandmother   . Breast cancer Paternal Grandmother   . Diabetes Paternal Grandfather     Social History   Socioeconomic History  . Marital status: Single    Spouse name: Not on file  . Number of children: Not on file  . Years of education: Not on file  . Highest education level: Not on file  Occupational History  . Not on file  Social Needs  . Financial resource strain: Not on file  . Food insecurity:    Worry: Not on file    Inability: Not on file  . Transportation needs:    Medical: Not on file    Non-medical: Not on file  Tobacco Use  . Smoking status: Former Smoker    Types: Cigarettes  . Smokeless tobacco: Never Used  Substance and Sexual Activity  . Alcohol use: Yes    Comment: has drank beer in past, not regularly  . Drug use: No  . Sexual activity: Yes    Birth control/protection: Condom  Lifestyle  . Physical activity:  Days per week: Not on file    Minutes per session: Not on file  . Stress: Not on file  Relationships  . Social connections:    Talks on phone: Not on file    Gets together: Not on file    Attends religious service: Not on file    Active member of club or organization: Not on file    Attends meetings of clubs or organizations: Not on file    Relationship status: Not on file  . Intimate partner violence:    Fear of current or ex partner: Not on file    Emotionally abused: Not on file    Physically abused: Not on file    Forced sexual activity: Not on file  Other Topics Concern  . Not on file  Social History Narrative   Lives at home with Mom, Dad, and Sister. 3 dogs. Mom & Dad smoke outside, not in front of kids.       ROS:  General: Negative for anorexia, weight loss, fever, chills, fatigue, weakness. Eyes: Negative for vision changes.  ENT: Negative for hoarseness, difficulty swallowing , nasal congestion. CV: Negative for chest pain, angina, palpitations, dyspnea on exertion, peripheral edema.  Respiratory: Negative for dyspnea at rest, dyspnea on exertion, cough, sputum, wheezing.  GI: See history of present illness. GU:  Negative for dysuria, hematuria, urinary incontinence, urinary frequency, nocturnal urination.  MS: Negative for joint pain, low back pain.  Derm: Negative for rash or itching.  Neuro: Negative for weakness, abnormal sensation, seizure, frequent headaches, memory loss, confusion.  Psych: Negative for anxiety, depression, suicidal ideation, hallucinations.  Endo: Negative for unusual weight change.  Heme: Negative for bruising or bleeding. Allergy: Negative for rash or hives.    Physical Examination:  BP 135/81   Pulse 66   Temp 97.8 F (36.6 C) (Oral)   Ht 5\' 6"  (1.676 m)   Wt 174 lb 12.8 oz (79.3 kg)   BMI 28.21 kg/m    General: Well-nourished, well-developed in no acute distress.  Head: Normocephalic, atraumatic.   Eyes: Conjunctiva pink, no icterus. Mouth: Oropharyngeal mucosa moist and pink , no lesions erythema or exudate. Neck: Supple without thyromegaly, masses, or lymphadenopathy.  Lungs: Clear to auscultation bilaterally.  Heart: Regular rate and rhythm, no murmurs rubs or gallops.  Abdomen: Bowel sounds are normal, nontender, nondistended, no hepatosplenomegaly or masses, no abdominal bruits or    hernia , no rebound or guarding.   Rectal: Not performed Extremities: No lower extremity edema. No clubbing or deformities.  Neuro: Alert and oriented x 4 , grossly normal neurologically.  Skin: Warm and dry, no rash or jaundice.   Psych: Alert and cooperative, normal mood and affect.  Labs: Lab Results  Component Value Date   CREATININE 0.79  08/31/2017   BUN 13 08/31/2017   NA 138 08/31/2017   K 4.2 08/31/2017   CL 103 08/31/2017   CO2 27 08/31/2017   Lab Results  Component Value Date   ALT 27 08/18/2017   AST 28 08/18/2017   BILITOT 0.6 08/18/2017   Lab Results  Component Value Date   WBC 6.7 08/18/2017   HGB 13.8 08/18/2017   HCT 39.9 08/18/2017   MCV 87.3 08/18/2017   PLT 243 08/18/2017   Lab Results  Component Value Date   LIPASE 26 10/17/2016     Imaging Studies: No results found.

## 2018-05-12 NOTE — Telephone Encounter (Signed)
PA for EGD submitted via [employer] website. 

## 2018-05-15 NOTE — Progress Notes (Signed)
CC'D TO PCP °

## 2018-05-15 NOTE — Telephone Encounter (Signed)
Tresa EndoKelly at [facility] called office. No PA needed for EGD. Ref# [id number] kellyr.

## 2018-05-18 LAB — COMPREHENSIVE METABOLIC PANEL
AG RATIO: 2 (calc) (ref 1.0–2.5)
ALT: 11 U/L (ref 9–46)
AST: 18 U/L (ref 10–40)
Albumin: 4.9 g/dL (ref 3.6–5.1)
Alkaline phosphatase (APISO): 68 U/L (ref 40–115)
BILIRUBIN TOTAL: 0.6 mg/dL (ref 0.2–1.2)
BUN: 14 mg/dL (ref 7–25)
CALCIUM: 9.9 mg/dL (ref 8.6–10.3)
CO2: 27 mmol/L (ref 20–32)
Chloride: 102 mmol/L (ref 98–110)
Creat: 0.96 mg/dL (ref 0.60–1.35)
GLUCOSE: 135 mg/dL (ref 65–139)
Globulin: 2.4 g/dL (calc) (ref 1.9–3.7)
Potassium: 4.4 mmol/L (ref 3.5–5.3)
Sodium: 139 mmol/L (ref 135–146)
Total Protein: 7.3 g/dL (ref 6.1–8.1)

## 2018-05-18 LAB — CBC WITH DIFFERENTIAL/PLATELET
Basophils Absolute: 46 cells/uL (ref 0–200)
Basophils Relative: 0.6 %
EOS ABS: 69 {cells}/uL (ref 15–500)
Eosinophils Relative: 0.9 %
HCT: 41.9 % (ref 38.5–50.0)
HEMOGLOBIN: 14.6 g/dL (ref 13.2–17.1)
LYMPHS ABS: 3188 {cells}/uL (ref 850–3900)
MCH: 30.6 pg (ref 27.0–33.0)
MCHC: 34.8 g/dL (ref 32.0–36.0)
MCV: 87.8 fL (ref 80.0–100.0)
MPV: 11.1 fL (ref 7.5–12.5)
Monocytes Relative: 5 %
NEUTROS ABS: 4012 {cells}/uL (ref 1500–7800)
NEUTROS PCT: 52.1 %
Platelets: 304 10*3/uL (ref 140–400)
RBC: 4.77 10*6/uL (ref 4.20–5.80)
RDW: 11.8 % (ref 11.0–15.0)
Total Lymphocyte: 41.4 %
WBC: 7.7 10*3/uL (ref 3.8–10.8)
WBCMIX: 385 {cells}/uL (ref 200–950)

## 2018-05-18 LAB — LIPASE: LIPASE: 19 U/L (ref 7–60)

## 2018-05-18 LAB — IGA: IMMUNOGLOBULIN A: 157 mg/dL (ref 47–310)

## 2018-05-18 LAB — ALPHA-GAL PANEL
Beef IgE: 0.16 kU/L (ref <0.35)
CLASS: 0
Galactose-alpha-1,3-galactose IgE: 0.21 kU/L — ABNORMAL HIGH (ref <0.10)
LAMB/MUTTON IGE: <0.1 kU/L (ref <0.35)
PORK IGE: 0.1 kU/L (ref <0.35)

## 2018-05-18 LAB — TISSUE TRANSGLUTAMINASE, IGA: (tTG) Ab, IgA: 1 U/mL

## 2018-05-22 ENCOUNTER — Other Ambulatory Visit: Payer: Self-pay

## 2018-05-22 DIAGNOSIS — Z91018 Allergy to other foods: Secondary | ICD-10-CM

## 2018-05-22 NOTE — Progress Notes (Signed)
Forwarding to Darl PikesSusan and [facility].

## 2018-06-13 ENCOUNTER — Other Ambulatory Visit: Payer: Self-pay | Admitting: Gastroenterology

## 2018-07-13 ENCOUNTER — Telehealth: Payer: Self-pay | Admitting: Gastroenterology

## 2018-07-13 NOTE — Telephone Encounter (Signed)
Spoke with patient and he has [employer] [place]. I advised no PA is required for this and to make sure he takes his new card to him appt

## 2018-07-13 NOTE — Telephone Encounter (Signed)
Pt's mother, Corky Mull, called to say that patient has procedure with [facility] tomorrow and he has a different insurance since being scheduled. She is going to fax over his new card front and back. Please advise if he will need a PA and call mother back. She works at [employer] office.

## 2018-07-14 ENCOUNTER — Encounter (HOSPITAL_COMMUNITY): Payer: Self-pay | Admitting: *Deleted

## 2018-07-14 ENCOUNTER — Encounter (HOSPITAL_COMMUNITY)
Admission: RE | Disposition: A | Discharge status: Home / Self Care | Payer: Self-pay | Source: Home / Self Care | Attending: Gastroenterology

## 2018-07-14 ENCOUNTER — Other Ambulatory Visit: Payer: Self-pay

## 2018-07-14 ENCOUNTER — Ambulatory Visit (HOSPITAL_COMMUNITY)
Admission: RE | Admit: 2018-07-14 | Discharge: 2018-07-14 | Disposition: A | Discharge status: Home / Self Care | Payer: BC Managed Care – PPO | Attending: Gastroenterology | Admitting: Gastroenterology

## 2018-07-14 DIAGNOSIS — K2970 Gastritis, unspecified, without bleeding: Secondary | ICD-10-CM

## 2018-07-14 DIAGNOSIS — Z803 Family history of malignant neoplasm of breast: Secondary | ICD-10-CM | POA: Diagnosis not present

## 2018-07-14 DIAGNOSIS — Z8249 Family history of ischemic heart disease and other diseases of the circulatory system: Secondary | ICD-10-CM | POA: Insufficient documentation

## 2018-07-14 DIAGNOSIS — J45909 Unspecified asthma, uncomplicated: Secondary | ICD-10-CM | POA: Insufficient documentation

## 2018-07-14 DIAGNOSIS — Z79899 Other long term (current) drug therapy: Secondary | ICD-10-CM | POA: Insufficient documentation

## 2018-07-14 DIAGNOSIS — F909 Attention-deficit hyperactivity disorder, unspecified type: Secondary | ICD-10-CM | POA: Insufficient documentation

## 2018-07-14 DIAGNOSIS — Z87891 Personal history of nicotine dependence: Secondary | ICD-10-CM | POA: Insufficient documentation

## 2018-07-14 DIAGNOSIS — K2950 Unspecified chronic gastritis without bleeding: Secondary | ICD-10-CM | POA: Diagnosis not present

## 2018-07-14 DIAGNOSIS — R112 Nausea with vomiting, unspecified: Secondary | ICD-10-CM

## 2018-07-14 DIAGNOSIS — Z833 Family history of diabetes mellitus: Secondary | ICD-10-CM | POA: Diagnosis not present

## 2018-07-14 DIAGNOSIS — Z791 Long term (current) use of non-steroidal anti-inflammatories (NSAID): Secondary | ICD-10-CM | POA: Insufficient documentation

## 2018-07-14 DIAGNOSIS — Z8619 Personal history of other infectious and parasitic diseases: Secondary | ICD-10-CM | POA: Diagnosis not present

## 2018-07-14 DIAGNOSIS — R1013 Epigastric pain: Secondary | ICD-10-CM

## 2018-07-14 HISTORY — PX: ESOPHAGOGASTRODUODENOSCOPY: SHX5428

## 2018-07-14 HISTORY — PX: BIOPSY: SHX5522

## 2018-07-14 SURGERY — EGD (ESOPHAGOGASTRODUODENOSCOPY)
Anesthesia: Moderate Sedation

## 2018-07-14 MED ORDER — MEPERIDINE HCL 100 MG/ML IJ SOLN
INTRAMUSCULAR | Status: DC | PRN
  Administered 2018-07-14: 25 mg
  Administered 2018-07-14: 50 mg
  Administered 2018-07-14 (×2): 25 mg

## 2018-07-14 MED ORDER — MIDAZOLAM HCL 5 MG/5ML IJ SOLN
INTRAMUSCULAR | Status: AC
  Filled 2018-07-14: qty 10

## 2018-07-14 MED ORDER — PANTOPRAZOLE SODIUM 40 MG PO TBEC: DELAYED_RELEASE_TABLET | ORAL | 11 refills | Status: DC

## 2018-07-14 MED ORDER — MEPERIDINE HCL 100 MG/ML IJ SOLN
INTRAMUSCULAR | Status: AC
  Filled 2018-07-14: qty 2

## 2018-07-14 MED ORDER — LIDOCAINE VISCOUS HCL 2 % MT SOLN
OROMUCOSAL | Status: DC | PRN
  Administered 2018-07-14: 1 via OROMUCOSAL

## 2018-07-14 MED ORDER — PROMETHAZINE HCL 25 MG/ML IJ SOLN
INTRAMUSCULAR | Status: DC | PRN
  Administered 2018-07-14: 12.5 mg via INTRAVENOUS

## 2018-07-14 MED ORDER — PROMETHAZINE HCL 25 MG/ML IJ SOLN
INTRAMUSCULAR | Status: AC
  Filled 2018-07-14: qty 1

## 2018-07-14 MED ORDER — MIDAZOLAM HCL 5 MG/5ML IJ SOLN
INTRAMUSCULAR | Status: DC | PRN
  Administered 2018-07-14 (×4): 2 mg via INTRAVENOUS

## 2018-07-14 MED ORDER — LIDOCAINE VISCOUS HCL 2 % MT SOLN
OROMUCOSAL | Status: AC
  Filled 2018-07-14: qty 15

## 2018-07-14 MED ORDER — SODIUM CHLORIDE 0.9 % IV SOLN
INTRAVENOUS | Status: DC
  Administered 2018-07-14: 1000 mL via INTRAVENOUS

## 2018-07-14 NOTE — Op Note (Addendum)
[facility] Patient Name: Ricardo Stevens Procedure Date: 07/14/2018 10:12 AM MRN: [id number] Date of Birth: 03-23-1996 Attending MD: Jonette Eva MD, MD CSN: [id number] Age: 23 Admit Type: Outpatient Procedure:                Upper GI endoscopy WITH COLD FORCEPS BIOPSY Indications:              Nausea with vomiting, Persistent vomiting of                            unknown cause. PROTONIX ONCE DAILY DID NOT HELP.                            USES IBUPROFEN. Providers:                Jonette Eva MD, MD, Jannett Celestine, RN, Sterling Big, RN, Burke Keels, Technician Referring MD:             Aliene Beams Medicines:                Promethazine 12.5 mg IV, Meperidine 125 mg IV,                            Midazolam 8 mg IV Complications:            No immediate complications. Estimated Blood Loss:     Estimated blood loss was minimal. Procedure:                Pre-Anesthesia Assessment:                           - Prior to the procedure, a History and Physical                            was performed, and patient medications and                            allergies were reviewed. The patient's tolerance of                            previous anesthesia was also reviewed. The risks                            and benefits of the procedure and the sedation                            options and risks were discussed with the patient.                            All questions were answered, and informed consent                            was obtained. Prior Anticoagulants: The patient has  taken ibuprofen. ASA Grade Assessment: II - A                            patient with mild systemic disease. After reviewing                            the risks and benefits, the patient was deemed in                            satisfactory condition to undergo the procedure.                            After obtaining informed consent, the endoscope was                             passed under direct vision. Throughout the                            procedure, the patient's blood pressure, pulse, and                            oxygen saturations were monitored continuously. The                            [id number]-[id number] ([id number] scope was introduced through the                            mouth, and advanced to the second part of duodenum.                            The upper GI endoscopy was technically difficult                            and complex due to the patient's inability to                            tolerate conscious sedation. Successful completion                            of the procedure was aided by increasing the dose                            of sedation medication. The patient tolerated the                            procedure well. Scope In: 10:53:12 AM Scope Out: 11:01:39 AM Total Procedure Duration: 0 hours 8 minutes 27 seconds  Findings:      The examined esophagus was normal.      Diffuse mild inflammation characterized by congestion (edema) and       erythema was found in the gastric antrum. Biopsies(2: BODY, 3:       ANTRUM-BTL 3) were taken with a cold forceps for Helicobacter pylori       testing.  The second portion of the duodenum was normal. Biopsies(4: BTL 1) for       histology were taken with a cold forceps for evaluation of celiac       disease.      The duodenal bulb was normal. Biopsies(2: BTL 2) for histology were       taken with a cold forceps for evaluation of celiac disease. Impression:               - NAUSEA/VOMITING DUE TO GERD/GASTRITIS/DUODENITIS                           . Moderate Sedation:      Moderate (conscious) sedation was administered by the endoscopy nurse       and supervised by the endoscopist. The following parameters were       monitored: oxygen saturation, heart rate, blood pressure, and response       to care. Total physician intraservice time was 27  minutes. Recommendation:           - Patient has a contact number available for                            emergencies. The signs and symptoms of potential                            delayed complications were discussed with the                            patient. Return to normal activities tomorrow.                            Written discharge instructions were provided to the                            patient.                           - Low fat diet. AVOID REFLUX TRIGGERS. LOSE WEIGHT.                           - Continue present medications. INCREASE PROTONIX                            TO BID.                           - Await pathology results.                           - Return to GI office in 4 months. Procedure Code(s):        --- Professional ---                           [phone], Esophagogastroduodenoscopy, flexible,                            transoral; with biopsy, single or multiple  [id number], Moderate sedation; each additional 15                            minutes intraservice time                           G0500, Moderate sedation services provided by the                            same physician or other qualified health care                            professional performing a gastrointestinal                            endoscopic service that sedation supports,                            requiring the presence of an independent trained                            observer to assist in the monitoring of the                            patient's level of consciousness and physiological                            status; initial 15 minutes of intra-service time;                            patient age 12 years or older (additional time [place]                            be reported with [id number], as appropriate) Diagnosis Code(s):        --- Professional ---                           K29.70, Gastritis, unspecified, without bleeding                            R11.2, Nausea with vomiting, unspecified                           R11.10, Vomiting, unspecified CPT copyright 2018 American Medical Association. All rights reserved. The codes documented in this report are preliminary and upon coder review Koop  be revised to meet current compliance requirements. Jonette EvaSandi Graiden Henes, MD Jonette EvaSandi Enma Maeda MD, MD 07/14/2018 11:24:17 AM This report has been signed electronically. Number of Addenda: 0

## 2018-07-14 NOTE — H&P (Addendum)
Primary Care Physician:  Aliene BeamsHagler, Rachel, MD Primary Gastroenterologist:  Dr. Darrick PennaFields  Pre-Procedure History & Physical: HPI:  Ricardo Stevens is a 23 y.o. male here for NAUSEA/VOMITING. Use ibuprofen. Occasional ETOH. No THC. PROTONIX DID NOT HELP. ALMOST IMMEDIATELY THROWS UP AFTER EATING. NO WEIGHT LOSS.  Past Medical History:  Diagnosis Date  . ADHD (attention deficit hyperactivity disorder)   . Asthma   . Concussion   . Head injury, acute, without loss of consciousness   . Mononucleosis 07/2017    Past Surgical History:  Procedure Laterality Date  . ADENOIDECTOMY    . CIRCUMCISION    . TONSILLECTOMY    . TYMPANOSTOMY TUBE PLACEMENT Bilateral   . WISDOM TOOTH EXTRACTION      Prior to Admission medications   Medication Sig Start Date End Date Taking? Authorizing Provider  ibuprofen (ADVIL,MOTRIN) 200 MG tablet Take 400 mg by mouth every 6 (six) hours as needed for mild pain.   Yes [provider]  pantoprazole (PROTONIX) 40 MG tablet TAKE 1 TABLET BY MOUTH DAILY BEFORE BREAKFAST Patient not taking: Reported on 07/10/2018 06/14/18   Anice PaganiniGill, Eric A, NP    Allergies as of 05/12/2018  . (No Known Allergies)    Family History  Problem Relation Age of Onset  . Hypertension Mother   . Diabetes Maternal Grandmother   . Diabetes Maternal Grandfather   . Diabetes Paternal Grandmother   . Breast cancer Paternal Grandmother   . Diabetes Paternal Grandfather     Social History   Socioeconomic History  . Marital status: Single    Spouse name: Not on file  . Number of children: Not on file  . Years of education: Not on file  . Highest education level: Not on file  Occupational History  . Not on file  Social Needs  . Financial resource strain: Not on file  . Food insecurity:    Worry: Not on file    Inability: Not on file  . Transportation needs:    Medical: Not on file    Non-medical: Not on file  Tobacco Use  . Smoking status: Former Smoker    Types: Cigarettes   . Smokeless tobacco: Never Used  Substance and Sexual Activity  . Alcohol use: Yes    Comment: has drank beer in past, not regularly  . Drug use: No  . Sexual activity: Yes    Birth control/protection: Condom  Lifestyle  . Physical activity:    Days per week: Not on file    Minutes per session: Not on file  . Stress: Not on file  Relationships  . Social connections:    Talks on phone: Not on file    Gets together: Not on file    Attends religious service: Not on file    Active member of club or organization: Not on file    Attends meetings of clubs or organizations: Not on file    Relationship status: Not on file  . Intimate partner violence:    Fear of current or ex partner: Not on file    Emotionally abused: Not on file    Physically abused: Not on file    Forced sexual activity: Not on file  Other Topics Concern  . Not on file  Social History Narrative   Lives at home with Mom, Dad, and Sister. 3 dogs. Mom & Dad smoke outside, not in front of kids.    Review of Systems: See HPI, otherwise negative ROS   Physical Exam:  BP 138/72   Pulse 62   Temp 98.8 F (37.1 C) (Oral)   Resp 15   Ht 5\' 3"  (1.6 m)   Wt 81.6 kg   SpO2 99%   BMI 31.89 kg/m  General:   Alert,  pleasant and cooperative in NAD Head:  Normocephalic and atraumatic. Neck:  Supple; Lungs:  Clear throughout to auscultation.    Heart:  Regular rate and rhythm. Abdomen:  Soft, nontender and nondistended. Normal bowel sounds, without guarding, and without rebound.   Neurologic:  Alert and  oriented x4;  grossly normal neurologically.  Impression/Plan:     NAUSEA/VOMITING.  PLAN: EGD/? Dilation TODAY. DISCUSSED PROCEDURE, BENEFITS, & RISKS: < 1% chance of medication reaction, bleeding, OR perforation.

## 2018-07-14 NOTE — Discharge Instructions (Signed)
YOUR NAUSEA/VOMITING IS MOST LIKELY DUE TO REFLUX AND gastritis & DUODENITIS. I biopsied your stomach & DUODENUM.   DRINK WATER TO KEEP YOUR URINE LIGHT YELLOW.  AVOID REFLUX TRIGGERS. SEE INFO BELOW.  FOLLOW A LOW FAT DIET. MEATS SHOULD BE BAKED, BROILED, OR BOILED. Avoid fried foods. SEE INFO BELOW.  INCREASE PROTONIX. TAKE 30 MINUTES PRIOR TO MEALS TWICE DAILY.  YOUR BIOPSY RESULTS WILL BE BACK IN 5 BUSINESS DAYS.  FOLLOW UP IN 4 MOS.   UPPER ENDOSCOPY AFTER CARE Read the instructions outlined below and refer to this sheet in the next week. These discharge instructions provide you with general information on caring for yourself after you leave the hospital. While your treatment has been planned according to the most current medical practices available, unavoidable complications occasionally occur. If you have any problems or questions after discharge, call DR. Versia Mignogna, [phone].  ACTIVITY  You Degrasse resume your regular activity, but move at a slower pace for the next 24 hours.   Take frequent rest periods for the next 24 hours.   Walking will help get rid of the air and reduce the bloated feeling in your belly (abdomen).   No driving for 24 hours (because of the medicine (anesthesia) used during the test).   You Vangorden shower.   Do not sign any important legal documents or operate any machinery for 24 hours (because of the anesthesia used during the test).    NUTRITION  Drink plenty of fluids.   You Lafosse resume your normal diet as instructed by your doctor.   Begin with a light meal and progress to your normal diet. Heavy or fried foods are harder to digest and Brasel make you feel sick to your stomach (nauseated).   Avoid alcoholic beverages for 24 hours or as instructed.    MEDICATIONS  You Klunder resume your normal medications.   WHAT YOU CAN EXPECT TODAY  Some feelings of bloating in the abdomen.   Passage of more gas than usual.    IF YOU HAD A BIOPSY TAKEN  DURING THE UPPER ENDOSCOPY:  Eat a soft diet IF YOU HAVE NAUSEA, BLOATING, ABDOMINAL PAIN, OR VOMITING.    FINDING OUT THE RESULTS OF YOUR TEST Not all test results are available during your visit. DR. Darrick Penna WILL CALL YOU WITHIN 7 DAYS OF YOUR PROCEDUE WITH YOUR RESULTS. Do not assume everything is normal if you have not heard from DR. Kyzer Blowe IN ONE WEEK, CALL HER OFFICE AT [phone].  SEEK IMMEDIATE MEDICAL ATTENTION AND CALL THE OFFICE: [phone] IF:  You have more than a spotting of blood in your stool.   Your belly is swollen (abdominal distention).   You are nauseated or vomiting.   You have a temperature over 101F.   You have abdominal pain or discomfort that is severe or gets worse throughout the day.   Gastritis/DUODENITIS  Gastritis is an inflammation (the body's way of reacting to injury and/or infection) of the stomach. DUODENITIS is an inflammation (the body's way of reacting to injury and/or infection) of the FIRST PART OF THE SMALL INTESTINES. It is often caused by bacterial (germ) infections. It can also be caused BY ASPIRIN, BC/GOODY POWDER'S, (IBUPROFEN) MOTRIN, OR ALEVE (NAPROXEN), chemicals (including alcohol), SPICY FOODS, and medications. This illness Jagodzinski be associated with generalized malaise (feeling tired, not well), UPPER ABDOMINAL STOMACH cramps, and fever. One common bacterial cause of gastritis is an organism known as H. Pylori. This can be treated with antibiotics.  Lifestyle and home remedies TO MANAGE NAUSEA/VOMITING  You Shareef eliminate or reduce the frequency of heartburn by making the following lifestyle changes:   Control your weight. Being overweight is a major risk factor for heartburn and GERD. Excess pounds put pressure on your abdomen, pushing up your stomach and causing acid to back up into your esophagus.    Eat smaller meals. 4 TO 6 MEALS A DAY. This reduces pressure on the lower esophageal sphincter, helping to prevent the valve  from opening and acid from washing back into your esophagus.    Loosen your belt. Clothes that fit tightly around your waist put pressure on your abdomen and the lower esophageal sphincter.    Eliminate heartburn triggers. Everyone has specific triggers. Common triggers such as fatty or fried foods, spicy food, tomato sauce, carbonated beverages, alcohol, chocolate, mint, garlic, onion, caffeine and nicotine Henrie make heartburn worse.    Avoid stooping or bending. Tying your shoes is OK. Bending over for longer periods to weed your garden isn't, especially soon after eating.    Don't lie down after a meal. Wait at least three to four hours after eating before going to bed, and don't lie down right after eating.    PUT THE HEAD OF YOUR BED ON 6 INCH BLOCKS.   Alternative medicine  Several home remedies exist for treating GERD, but they provide only temporary relief. They include drinking baking soda (sodium bicarbonate) added to water or drinking other fluids such as baking soda mixed with cream of tartar and water.   Although these liquids create temporary relief by neutralizing, washing away or buffering acids, eventually they aggravate the situation by adding gas and fluid to your stomach, increasing pressure and causing more acid reflux. Further, adding more sodium to your diet Ferrelli increase your blood pressure and add stress to your heart, and excessive bicarbonate ingestion can alter the acid-base balance in your body.   Low-Fat Diet BREADS, CEREALS, PASTA, RICE, DRIED PEAS, AND BEANS These products are high in carbohydrates and most are low in fat. Therefore, they can be increased in the diet as substitutes for fatty foods. They too, however, contain calories and should not be eaten in excess. Cereals can be eaten for snacks as well as for breakfast.  Include foods that contain fiber (fruits, vegetables, whole grains, and legumes). Research shows that fiber Kia lower blood  cholesterol levels, especially the water-soluble fiber found in fruits, vegetables, oat products, and legumes. FRUITS AND VEGETABLES It is good to eat fruits and vegetables. Besides being sources of fiber, both are rich in vitamins and some minerals. They help you get the daily allowances of these nutrients. Fruits and vegetables can be used for snacks and desserts. MEATS Limit lean meat, chicken, Malawiturkey, and fish to no more than 6 ounces per day. Beef, Pork, and Lamb Use lean cuts of beef, pork, and lamb. Lean cuts include:  Extra-lean ground beef.  Arm roast.  Sirloin tip.  Center-cut ham.  Round steak.  Loin chops.  Rump roast.  Tenderloin.  Trim all fat off the outside of meats before cooking. It is not necessary to severely decrease the intake of red meat, but lean choices should be made. Lean meat is rich in protein and contains a highly absorbable form of iron. Premenopausal women, in particular, should avoid reducing lean red meat because this could increase the risk for low red blood cells (iron-deficiency anemia).  Chicken and Malawiurkey These are good sources of protein. The  fat of poultry can be reduced by removing the skin and underlying fat layers before cooking. Chicken and Malaw[place]urkey can be substituted for lean red meat in the diet. Poultry should not be fried or covered with high-fat sauces. Fish and Shellfish Fish is a good source of protein. Shellfish contain cholesterol, but they usually are low in saturated fatty acids. The preparation of fish is important. Like chicken and Malaw[place]urkey, they should not be fried or covered with high-fat sauces. EGGS Egg whites contain no fat or cholesterol. They can be eaten often. Try 1 to 2 egg whites instead of whole eggs in recipes or use egg substitutes that do not contain yolk.  MILK AND DAIRY PRODUCTS Use skim or 1% milk instead of 2% or whole milk. Decrease whole milk, natural, and processed cheeses. Use nonfat or low-fat (2%) cottage cheese  or low-fat cheeses made from vegetable oils. Choose nonfat or low-fat (1 to 2%) yogurt. Experiment with evaporated skim milk in recipes that call for heavy cream. Substitute low-fat yogurt or low-fat cottage cheese for sour cream in dips and salad dressings. Have at least 2 servings of low-fat dairy products, such as 2 glasses of skim (or 1%) milk each day to help get your daily calcium intake.  FATS AND OILS Butterfat, lard, and beef fats are high in saturated fat and cholesterol. These should be avoided.Vegetable fats do not contain cholesterol. AVOID coconut oil, palm oil, and palm kernel oil, WHICH are very high in saturated fats. These should be limited. These fats are often used in bakery goods, processed foods, popcorn, oils, and nondairy creamers. Vegetable shortenings and some peanut butters contain hydrogenated oils, which are also saturated fats. Read the labels on these foods and check for saturated vegetable oils.  Desirable liquid vegetable oils are corn oil, cottonseed oil, olive oil, canola oil, safflower oil, soybean oil, and sunflower oil. Peanut oil is not as good, but small amounts are acceptable. Buy a heart-healthy tub margarine that has no partially hydrogenated oils in the ingredients. AVOID Mayonnaise and salad dressings often are made from unsaturated fats.  OTHER EATING TIPS Snacks  Most sweets should be limited as snacks. They tend to be rich in calories and fats, and their caloric content outweighs their nutritional value. Some good choices in snacks are graham crackers, melba toast, soda crackers, bagels (no egg), English muffins, fruits, and vegetables. These snacks are preferable to snack crackers, Jamaica[place] fries, and chips. Popcorn should be air-popped or cooked in small amounts of liquid vegetable oil.  Desserts Eat fruit, low-fat yogurt, and fruit ices instead of pastries, cake, and cookies. Sherbet, angel food cake, gelatin dessert, frozen low-fat yogurt, or other frozen  products that do not contain saturated fat (pure fruit juice bars, frozen ice pops) are also acceptable.   COOKING METHODS Choose those methods that use little or no fat. They include: Poaching.  Braising.  Steaming.  Grilling.  Baking.  Stir-frying.  Broiling.  Microwaving.  Foods can be cooked in a nonstick pan without added fat, or use a nonfat cooking spray in regular cookware. Limit fried foods and avoid frying in saturated fat. Add moisture to lean meats by using water, broth, cooking wines, and other nonfat or low-fat sauces along with the cooking methods mentioned above. Soups and stews should be chilled after cooking. The fat that forms on top after a few hours in the refrigerator should be skimmed off. When preparing meals, avoid using excess salt. Salt can contribute to raising blood pressure  in some people.  EATING AWAY FROM HOME Order entres, potatoes, and vegetables without sauces or butter. When meat exceeds the size of a deck of cards (3 to 4 ounces), the rest can be taken home for another meal. Choose vegetable or fruit salads and ask for low-calorie salad dressings to be served on the side. Use dressings sparingly. Limit high-fat toppings, such as bacon, crumbled eggs, cheese, sunflower seeds, and olives. Ask for heart-healthy tub margarine instead of butter.

## 2018-07-18 ENCOUNTER — Encounter (HOSPITAL_COMMUNITY): Payer: Self-pay | Admitting: Gastroenterology

## 2018-07-18 ENCOUNTER — Encounter: Payer: Self-pay | Admitting: Gastroenterology

## 2018-07-18 ENCOUNTER — Telehealth: Payer: Self-pay | Admitting: Gastroenterology

## 2018-07-18 NOTE — Telephone Encounter (Signed)
Please call pt. His stomach Bx shows gastritis.  HER SMALL BOWEL BIOPSIES ARE NORMAL. HIS NAUSEA/VOMITING IS MOST LIKELY DUE TO REFLUX AND GASTRITIS.   DRINK WATER TO KEEP YOUR URINE LIGHT YELLOW.  AVOID REFLUX TRIGGERS.   FOLLOW A LOW FAT DIET. MEATS SHOULD BE BAKED, BROILED, OR BOILED. Avoid fried foods.   INCREASE PROTONIX. TAKE 30 MINUTES PRIOR TO MEALS TWICE DAILY.  FOLLOW UP IN 4 MOS.

## 2018-07-18 NOTE — Telephone Encounter (Signed)
Pt is aware.  

## 2018-07-18 NOTE — Telephone Encounter (Signed)
OV made and letter mailed °

## 2018-09-24 ENCOUNTER — Telehealth: Payer: BC Managed Care – PPO | Admitting: Family

## 2018-09-24 DIAGNOSIS — L237 Allergic contact dermatitis due to plants, except food: Secondary | ICD-10-CM | POA: Diagnosis not present

## 2018-09-24 MED ORDER — PREDNISONE 10 MG (21) PO TBPK: ORAL_TABLET | ORAL | 0 refills | Status: DC

## 2018-09-24 NOTE — Progress Notes (Signed)
We are sorry that you are not feeing well.  Here is how we plan to help!  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  This resin is very sticky, so it easily attaches to your skin, clothing, tools equipment, and pet's fur.    This blistering rash is often called poison ivy rash although it can come from contact with the leaves, stems and roots of poison ivy, poison oak and poison sumac.  The oily resin contains urushiol (u-ROO-she-ol) that produces a skin rash on exposed skin.  The severity of the rash depends on the amount of urushiol that gets on your skin.  A section of skin with more urushiol on it Verdun develop a rash sooner.  The rash usually develops 12-48 hours after exposure and can last two to three weeks.  Your skin must come in direct contact with the plant's oil to be affected.  Blister fluid doesn't spread the rash.  However, if you come into contact with a piece of clothing or pet fur that has urushiol on it, the rash Whang spread out.  You can also transfer the oil to other parts of your body with your fingers.  Often the rash looks like a straight line because of the way the plant brushes against your skin.    I have developed the following plan to treat your condition Since your rash is widespread or has resulted in a large number of blisters, I have prescribed an oral corticosteroid.  Please follow these recommendations:  I have sent a prednisone dose pack to your chosen pharmacy. Be sure to follow the instructions carefully and complete the entire prescription. You Solis use Benadryl or Caladryl topical lotions to sooth the itch and remember cool, not hot, showers and baths can help relieve the itching!  Place cool, wet compresses on the affected area for 15-30 minutes several times a day.  You Shambaugh also take oral antihistamines, such as diphenhydramine (Benadryl, others), which Coulibaly also help you sleep  better.  Watch your skin for any purulent (pus) drainage or red streaking from the site.  If this occurs, contact your provider.  You Rohrig require an antibiotic for a skin infection.  Make sure that the clothes you were wearing as well as any towels or sheets that Sarver have come in contact with the oil (urushiol) are washed in detergent and hot water.         What can you do to prevent this rash?  Avoid the plants.  Learn how to identify poison ivy, poison oak and poison sumac in all seasons.  When hiking or engaging in other activities that might expose you to these plants, try to stay on cleared pathways.  If camping, make sure you pitch your tent in an area free of these plants.  Keep pets from running through wooded areas so that urushiol doesn't accidentally stick to their fur, which you Driggs touch.  Remove or kill the plants.  In your yard, you can get rid of poison ivy by applying an herbicide or pulling it out of the ground, including the roots, while wearing heavy gloves.  Afterward remove the gloves and thoroughly wash them and your hands.  Don't burn poison ivy or related plants because the urushiol can be carried by smoke.  Wear protective clothing.  If needed, protect your skin by wearing socks, boots, pants, long   sleeves and vinyl gloves.  Wash your skin right away.  Washing off the oil with soap and water within 30 minutes of exposure Mcgahan reduce your chances of getting a poison ivy rash.  Even washing after an hour or so can help reduce the severity of the rash.  If you walk through some poison ivy and then later touch your shoes, you Hymes get some urushiol on your hands, which Mano then transfer to your face or body by touching or rubbing.  If the contaminated object isn't cleaned, the urushiol on it can still cause a skin reaction years later.    Be careful not to reuse towels after you have washed your skin.  Also carefully wash clothing in detergent and hot water to remove all traces of  the oil.  Handle contaminated clothing carefully so you don't transfer the urushiol to yourself, furniture, rugs or appliances.  Remember that pets can carry the oil on their fur and paws.  If you think your pet Mckenzie be contaminated with urushiol, put on some long rubber gloves and give your pet a bath.  Finally, be careful not to burn these plants as the smoke can contain traces of the oil.  Inhaling the smoke Petrosian result in difficulty breathing. If that occurred you should see a physician as soon as possible.  See your doctor right away if:   The reaction is severe or widespread  You inhaled the smoke from burning poison ivy and are having difficulty breathing  Your skin continues to swell  The rash affects your eyes, mouth or genitals  Blisters are oozing pus  You develop a fever greater than 100 F (37.8 C)  The rash doesn't get better within a few weeks.  If you scratch the poison ivy rash, bacteria under your fingernails Ziemba cause the skin to become infected.  See your doctor if pus starts oozing from the blisters.  Treatment generally includes antibiotics.  Poison ivy treatments are usually limited to self-care methods.  And the rash typically goes away on its own in two to three weeks.     If the rash is widespread or results in a large number of blisters, your doctor Boehm prescribe an oral corticosteroid, such as prednisone.  If a bacterial infection has developed at the rash site, your doctor Allor give you a prescription for an oral antibiotic.  MAKE SURE YOU   Understand these instructions.  Will watch your condition.  Will get help right away if you are not doing well or get worse.  Thank you for choosing an e-visit. Your e-visit answers were reviewed by a board certified advanced clinical practitioner to complete your personal care plan. Depending upon the condition, your plan could have included both over the counter or prescription medications.  Please review your  pharmacy choice. If there is a problem you Harbour use MyChart messaging to have the prescription routed to another pharmacy.   Your safety is important to us. If you have drug allergies check your prescription carefully.  You can use MyChart to ask questions about today's visit, request a non-urgent call back, or ask for a work or school excuse for 24 hours related to this e-Visit. If it has been greater than 24 hours you will need to follow up with your provider, or enter a new e-Visit to address those concerns.   You will get an email in the next two days asking about your experience. I hope that your e-visit has been   valuable and will speed your recovery Thank you for choosing an e-visit.      

## 2018-11-01 ENCOUNTER — Encounter: Payer: Self-pay | Admitting: Gastroenterology

## 2018-11-01 ENCOUNTER — Ambulatory Visit (INDEPENDENT_AMBULATORY_CARE_PROVIDER_SITE_OTHER): Payer: Self-pay | Admitting: Gastroenterology

## 2018-11-01 ENCOUNTER — Other Ambulatory Visit: Payer: Self-pay

## 2018-11-01 ENCOUNTER — Telehealth: Payer: Self-pay | Admitting: *Deleted

## 2018-11-01 DIAGNOSIS — R112 Nausea with vomiting, unspecified: Secondary | ICD-10-CM

## 2018-11-01 NOTE — Progress Notes (Signed)
No show

## 2018-11-01 NOTE — Telephone Encounter (Signed)
Called patient for virtual visit with LSL and I had to leave a message to call back. Patient did not return call.

## 2018-11-02 ENCOUNTER — Encounter: Payer: Self-pay | Admitting: Gastroenterology

## 2018-11-02 ENCOUNTER — Telehealth: Payer: Self-pay | Admitting: Gastroenterology

## 2018-11-02 NOTE — Telephone Encounter (Signed)
NO SHOWED PATIENT

## 2018-11-02 NOTE — Telephone Encounter (Signed)
PATIENT WAS A NO SHOW/NO ANSWER AND LETTER SENT  °

## 2019-04-26 ENCOUNTER — Telehealth: Payer: Self-pay | Admitting: *Deleted

## 2019-04-26 DIAGNOSIS — Z20822 Contact with and (suspected) exposure to covid-19: Secondary | ICD-10-CM

## 2019-04-26 DIAGNOSIS — Z20828 Contact with and (suspected) exposure to other viral communicable diseases: Secondary | ICD-10-CM

## 2019-04-27 LAB — NOVEL CORONAVIRUS, NAA: SARS-CoV-2, NAA: NOT DETECTED

## 2019-05-04 NOTE — Telephone Encounter (Signed)
Orders only

## 2019-06-07 ENCOUNTER — Other Ambulatory Visit: Payer: Self-pay

## 2019-06-07 DIAGNOSIS — Z20822 Contact with and (suspected) exposure to covid-19: Secondary | ICD-10-CM

## 2019-06-08 LAB — NOVEL CORONAVIRUS, NAA: SARS-CoV-2, NAA: DETECTED — AB

## 2019-06-15 IMAGING — CT CT ABD-PELV W/ CM
2 of 4 series · 16 of 46 positions shown, 18 images · IV contrast (Isovue)
Comparison: None.

CLINICAL DATA: Nausea, vomiting and abdominal pain.

EXAM:
CT ABDOMEN AND PELVIS WITH CONTRAST
TECHNIQUE: Multidetector CT imaging of the abdomen and pelvis was performed
using the standard protocol following bolus administration of
intravenous contrast.
CONTRAST:  100 cc Isovue 300

[Series 2: axial st · axial · 0.68mm/px · z∈[+934,+1359]mm · 13 of 93 slices shown, 15 images]
[im 4/93  soft-tissue]
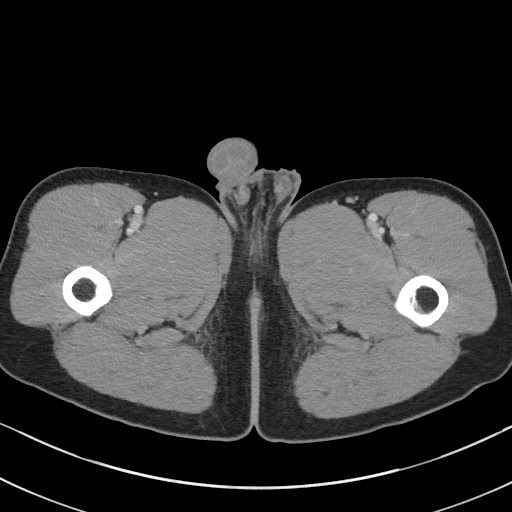
[im 4/93  bone]
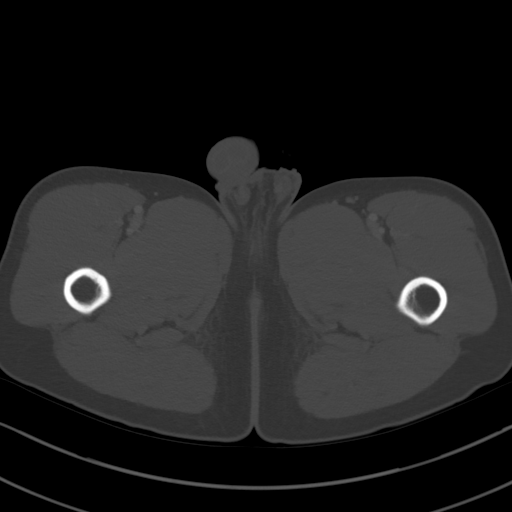
[im 12/93  soft-tissue]
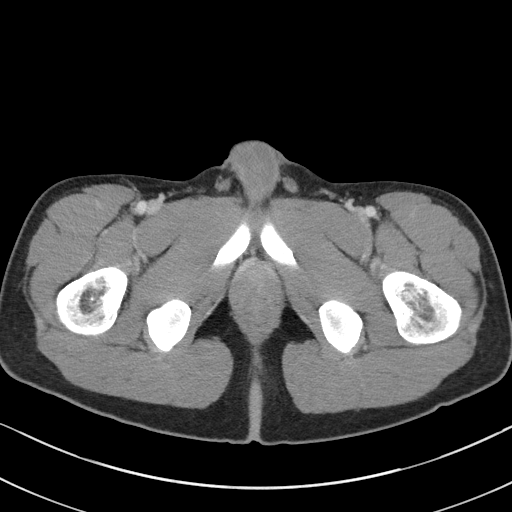
[im 20/93  soft-tissue]
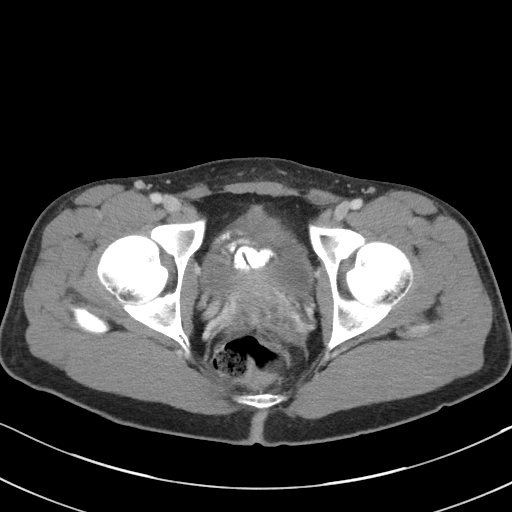
[im 27/93  soft-tissue]
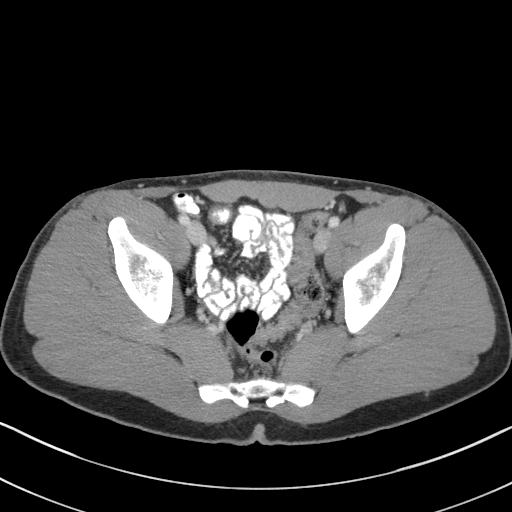
[im 31/93  soft-tissue]
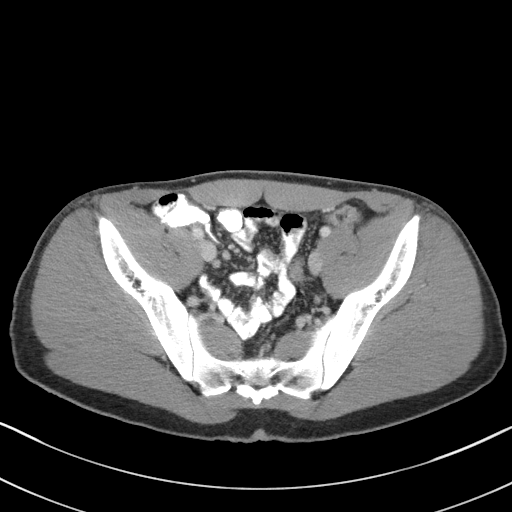
[im 39/93  soft-tissue]
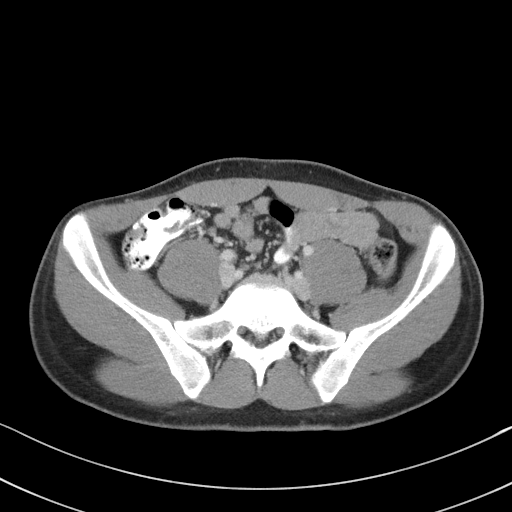
[im 47/93  soft-tissue]
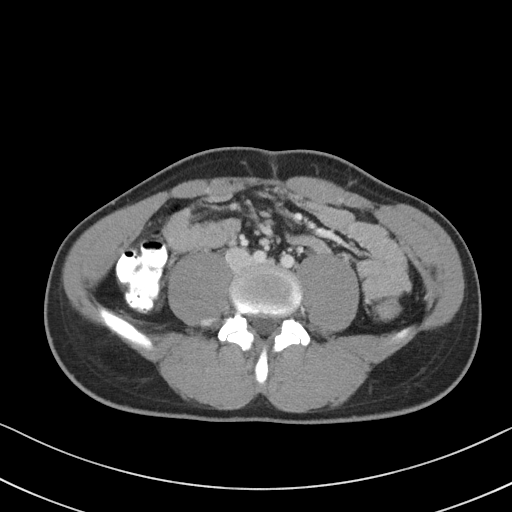
[im 54/93  soft-tissue]
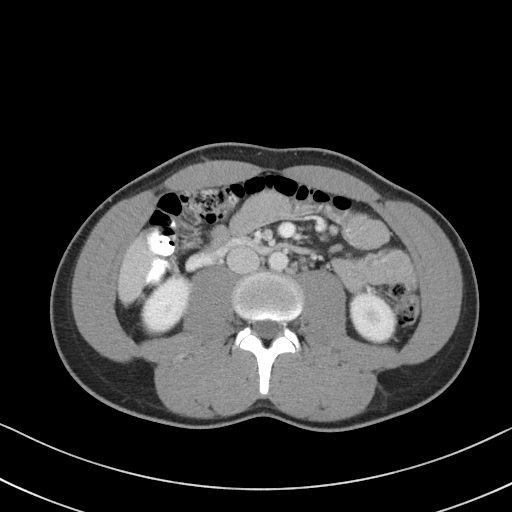
[im 62/93  soft-tissue]
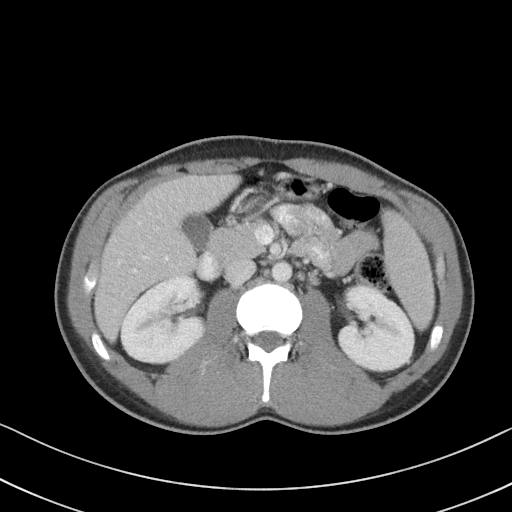
[im 62/93  bone]
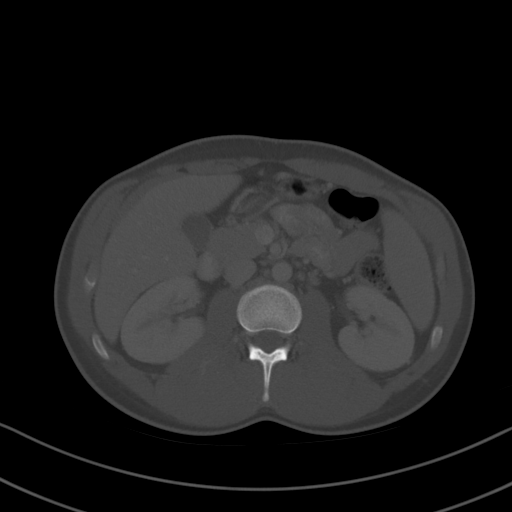
[im 66/93  soft-tissue]
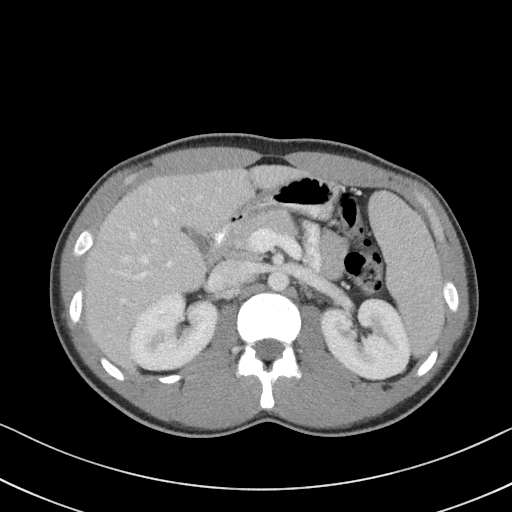
[im 73/93  soft-tissue]
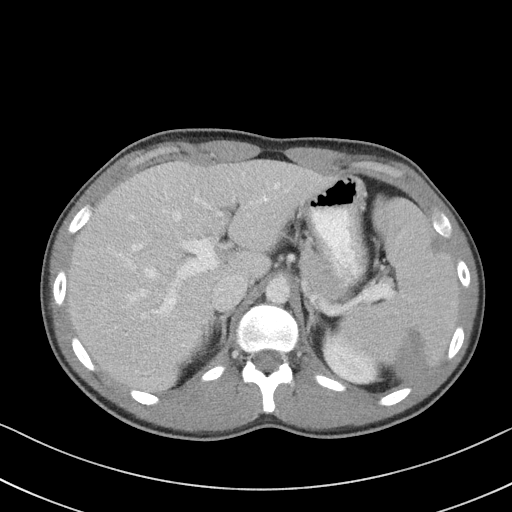
[im 81/93  soft-tissue]
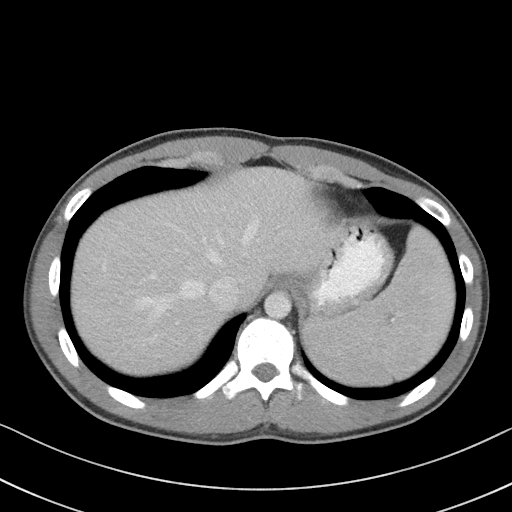
[im 89/93  soft-tissue]
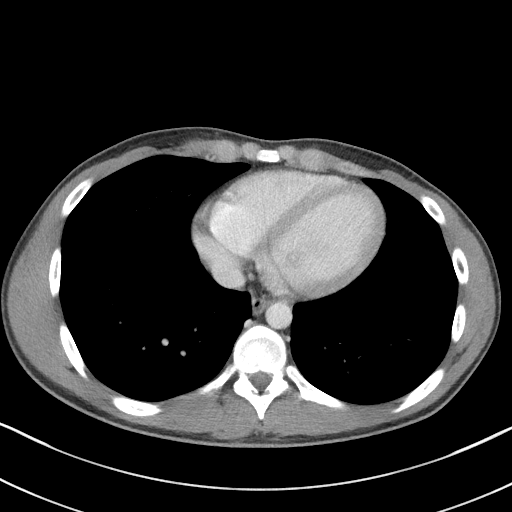

[Series 5: coronal st · coronal · 0.81mm/px · 3 of 85 slices shown]
[im 29/85  soft-tissue]
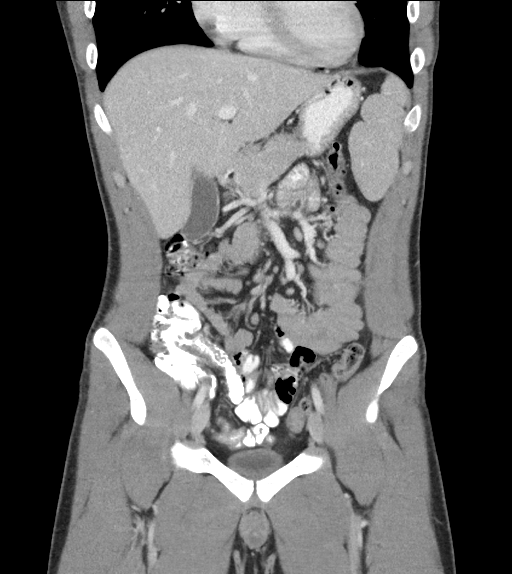
[im 38/85  soft-tissue]
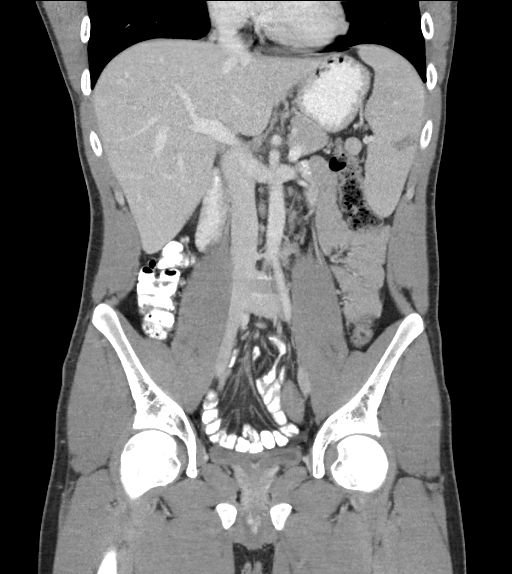
[im 47/85  soft-tissue]
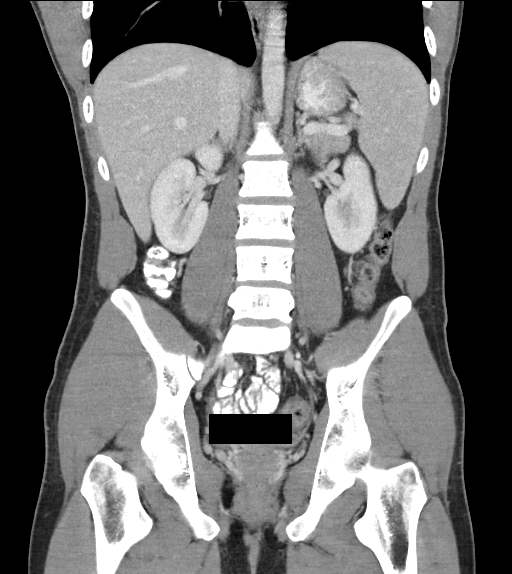

[16 of 46 positions shown; findings below may reference images not displayed]

FINDINGS: Lower chest: The lung bases are clear of acute process. No pleural
effusion or pulmonary lesions. The heart is normal in size. No
pericardial effusion. The distal esophagus and aorta are
unremarkable.

Hepatobiliary: No focal hepatic lesions or intrahepatic biliary
dilatation. The gallbladder is normal. No common bile duct
dilatation.

Pancreas: No mass, inflammation or ductal dilatation.

Spleen: The spleen is enlarged measuring 14.0 x 12.5 x 10.5 cm.
There are peripheral wedge-shaped defects involving the spleen which
are likely splenic infarcts. I do not think these are lacerations as
there is no perisplenic fluid collections or hematoma. Splenomegaly
could be due to mononucleosis.

Adrenals/Urinary Tract: The adrenal glands and kidneys are normal.
The bladder is normal.

Stomach/Bowel: The stomach, duodenum, small bowel and colon are
grossly normal without oral contrast. No inflammatory changes, mass
lesions or obstructive findings. The terminal ileum and appendix are
normal.

Vascular/Lymphatic: The aorta is normal in caliber. No dissection.
The branch vessels are patent. The major venous structures are
patent. No mesenteric or retroperitoneal mass or adenopathy. Small
scattered lymph nodes are noted.

Reproductive: The prostate gland and seminal vesicles are
unremarkable.

Other: Small amount of free pelvic fluid noted. No inguinal mass or
hernia.

Musculoskeletal: The bony structures are normal. No left-sided rib
fractures are identified.
IMPRESSION: 1. Splenomegaly and splenic infarcts.
2. Small amount of free pelvic fluid.
3. No other significant abdominal/pelvic findings are identified.

These results will be called to the ordering clinician or
representative by the Radiologist Assistant, and communication
documented in the PACS or zVision Dashboard.

## 2020-03-10 ENCOUNTER — Other Ambulatory Visit: Payer: Self-pay

## 2020-03-10 ENCOUNTER — Other Ambulatory Visit: Payer: BC Managed Care – PPO

## 2020-03-10 DIAGNOSIS — Z20822 Contact with and (suspected) exposure to covid-19: Secondary | ICD-10-CM

## 2020-03-11 LAB — SARS-COV-2, NAA 2 DAY TAT

## 2020-03-11 LAB — NOVEL CORONAVIRUS, NAA: SARS-CoV-2, NAA: NOT DETECTED

## 2020-03-13 ENCOUNTER — Other Ambulatory Visit: Payer: Self-pay

## 2020-03-13 ENCOUNTER — Other Ambulatory Visit: Payer: Self-pay | Admitting: *Deleted

## 2020-03-13 ENCOUNTER — Other Ambulatory Visit: Payer: BC Managed Care – PPO

## 2020-03-13 DIAGNOSIS — Z20822 Contact with and (suspected) exposure to covid-19: Secondary | ICD-10-CM

## 2020-03-15 LAB — SPECIMEN STATUS REPORT

## 2020-03-15 LAB — SARS-COV-2, NAA 2 DAY TAT

## 2020-03-15 LAB — NOVEL CORONAVIRUS, NAA: SARS-CoV-2, NAA: DETECTED — AB

## 2020-03-24 ENCOUNTER — Other Ambulatory Visit: Payer: BC Managed Care – PPO

## 2020-03-24 DIAGNOSIS — Z20822 Contact with and (suspected) exposure to covid-19: Secondary | ICD-10-CM

## 2020-03-25 LAB — SARS-COV-2, NAA 2 DAY TAT

## 2020-03-25 LAB — NOVEL CORONAVIRUS, NAA: SARS-CoV-2, NAA: NOT DETECTED

## 2020-03-31 ENCOUNTER — Other Ambulatory Visit: Payer: BC Managed Care – PPO

## 2020-03-31 DIAGNOSIS — Z20822 Contact with and (suspected) exposure to covid-19: Secondary | ICD-10-CM

## 2020-04-01 LAB — SARS-COV-2, NAA 2 DAY TAT

## 2020-04-01 LAB — NOVEL CORONAVIRUS, NAA: SARS-CoV-2, NAA: NOT DETECTED

## 2020-04-02 ENCOUNTER — Other Ambulatory Visit: Payer: Self-pay

## 2020-04-02 ENCOUNTER — Other Ambulatory Visit: Payer: BC Managed Care – PPO

## 2020-04-02 DIAGNOSIS — Z20822 Contact with and (suspected) exposure to covid-19: Secondary | ICD-10-CM

## 2020-04-03 LAB — SPECIMEN STATUS REPORT

## 2020-04-03 LAB — SARS-COV-2, NAA 2 DAY TAT

## 2020-04-03 LAB — NOVEL CORONAVIRUS, NAA: SARS-CoV-2, NAA: NOT DETECTED

## 2020-04-07 ENCOUNTER — Other Ambulatory Visit: Payer: BC Managed Care – PPO

## 2020-04-07 DIAGNOSIS — Z20822 Contact with and (suspected) exposure to covid-19: Secondary | ICD-10-CM

## 2020-04-09 LAB — NOVEL CORONAVIRUS, NAA: SARS-CoV-2, NAA: NOT DETECTED

## 2020-04-09 LAB — SARS-COV-2, NAA 2 DAY TAT

## 2020-04-14 ENCOUNTER — Other Ambulatory Visit: Payer: BC Managed Care – PPO

## 2020-04-14 DIAGNOSIS — Z20822 Contact with and (suspected) exposure to covid-19: Secondary | ICD-10-CM

## 2020-04-15 LAB — SARS-COV-2, NAA 2 DAY TAT

## 2020-04-15 LAB — NOVEL CORONAVIRUS, NAA: SARS-CoV-2, NAA: NOT DETECTED

## 2020-04-21 ENCOUNTER — Other Ambulatory Visit: Payer: BC Managed Care – PPO

## 2020-04-21 DIAGNOSIS — Z20822 Contact with and (suspected) exposure to covid-19: Secondary | ICD-10-CM

## 2020-04-22 LAB — NOVEL CORONAVIRUS, NAA: SARS-CoV-2, NAA: NOT DETECTED

## 2020-04-22 LAB — SARS-COV-2, NAA 2 DAY TAT

## 2020-04-28 ENCOUNTER — Other Ambulatory Visit: Payer: BC Managed Care – PPO

## 2020-04-28 DIAGNOSIS — Z20822 Contact with and (suspected) exposure to covid-19: Secondary | ICD-10-CM

## 2020-04-29 LAB — SARS-COV-2, NAA 2 DAY TAT

## 2020-04-29 LAB — NOVEL CORONAVIRUS, NAA: SARS-CoV-2, NAA: NOT DETECTED

## 2020-05-03 ENCOUNTER — Other Ambulatory Visit: Payer: Self-pay

## 2020-05-03 ENCOUNTER — Encounter (HOSPITAL_COMMUNITY): Payer: Self-pay | Admitting: Emergency Medicine

## 2020-05-03 ENCOUNTER — Ambulatory Visit (HOSPITAL_COMMUNITY)
Admission: EM | Admit: 2020-05-03 | Discharge: 2020-05-04 | Disposition: A | Discharge status: Home / Self Care | Payer: BC Managed Care – PPO

## 2020-05-03 DIAGNOSIS — F10129 Alcohol abuse with intoxication, unspecified: Secondary | ICD-10-CM

## 2020-05-03 NOTE — ED Triage Notes (Signed)
Present with Suicidal ideations, no plan noted,, no previous attempts noted.  Depression and anxiety also.  ETOH, 12 beers and smoked marijuana.  Denies HI or AVH.

## 2020-05-04 NOTE — ED Provider Notes (Signed)
Behavioral Health Urgent Care Medical Screening Exam  Patient Name: Ricardo Stevens MRN: [id number] Date of Evaluation: 05/04/20 Chief Complaint:   Diagnosis:  Final diagnoses:  Alcohol abuse with intoxication (HCC)    History of Present illness: Ricardo Stevens is a 23 y.o. male patient brought in to [facility] voluntarily due to him being intoxicated. Before the patient arrived, he was voicing SI. During the patient assessment, he denies SI/HI/AVH. The patient expressed he has been diagnosed with depression and was prescribed Prozac, but he stopped taking the medications. He voiced that he does not need any medicines. He stated he drinks a lot. He disclosed that he drank 12 cans of beer today and a couple of shots. He also smoked some Marijuana today (11.06.21) about a gram. The patient is alert and oriented x4, calm and cooperative, and mood-congruent with affect. The patient does not appear to be responding to internal or external stimuli. Neither is the patient presenting with any delusional thinking. The patient denies auditory or visual hallucinations. The patient denies any suicidal, homicidal, or self-harm ideations. The patient is not presenting with any psychotic or paranoid behaviors. During an encounter with the patient, he was able to answer questions appropriately.  Psychiatric Specialty Exam  Presentation  General Appearance:Appropriate for Environment  Eye Contact:Good  Speech:Clear and Coherent  Speech Volume:Normal  Handedness:Right   Mood and Affect  Mood:Euthymic  Affect:Congruent;Appropriate   Thought Process  Thought Processes:Coherent  Descriptions of Associations:Intact  Orientation:Full (Time, Place and Person)  Thought Content:Logical  Hallucinations:None  Ideas of Reference:No data recorded Suicidal Thoughts:No  Homicidal Thoughts:No   Sensorium  Memory:Immediate Good;Recent Good;Remote Good  Judgment:Intact  Insight:Good   Executive  Functions  Concentration:Good  Attention Span:Good  Recall:Good  Fund of Knowledge:Good  Language:Good   Psychomotor Activity  Psychomotor Activity:Normal   Assets  Assets:Communication Skills;Social Support   Sleep  Sleep:Poor  Number of hours: 3   Physical Exam: Physical Exam Vitals and nursing note reviewed.  Constitutional:      Appearance: Normal appearance.  HENT:     Nose: Nose normal.     Mouth/Throat:     Mouth: Mucous membranes are moist.  Cardiovascular:     Rate and Rhythm: Tachycardia present.  Pulmonary:     Effort: Pulmonary effort is normal.  Musculoskeletal:        General: Normal range of motion.     Cervical back: Normal range of motion and neck supple.  Neurological:     General: No focal deficit present.     Mental Status: He is alert and oriented to person, place, and time.  Psychiatric:        Attention and Perception: Attention and perception normal.        Mood and Affect: Mood and affect normal.        Speech: Speech normal.        Behavior: Behavior normal. Behavior is cooperative.        Thought Content: Thought content normal.        Cognition and Memory: Cognition and memory normal.        Judgment: Judgment normal.    Review of Systems  Psychiatric/Behavioral: Positive for substance abuse. The patient has insomnia.   All other systems reviewed and are negative.  Blood pressure 136/82, pulse (!) 114, temperature 98.6 F (37 C), temperature source Tympanic, resp. rate 18, SpO2 98 %. There is no height or weight on file to calculate BMI.  Musculoskeletal: Strength & Muscle Tone:  within normal limits Gait & Station: normal Patient leans: N/A   [facility] MSE Discharge Disposition for Follow up and Recommendations: Based on my evaluation the patient does not appear to have an emergency medical condition and can be discharged with resources and follow up care in outpatient services for Medication Management and Substance Abuse  Intensive Outpatient Program   Gillermo Murdoch, NP 05/04/2020, 12:34 AM

## 2020-05-04 NOTE — BH Assessment (Signed)
Comprehensive Clinical Assessment (CCA) Screening, Triage and Referral Note  05/04/2020 Ricardo Stevens [id number]   Ricardo Stevens is a 24 year old male who presents to [facility] voluntarily accompanied by [employer] for alcohol intoxication and depression. Pt states that he has been having a hard time with life and issues with his girlfriend. Pt states he drank 12 beers and a few shots of liquor earlier tonight, states he drinks occasionally but not everyday. Pt admits to smoking marijuana tonight but states he smokes whenever he is stressed but not often. Pt denies , HI, AVH, and SIB, denies any previous SI attempts.  Pt mentions that he did have some passive SI thoughts earlier but no plan to hurt himself, states he is not SI now.Pt reports he does deal with depression and was taking Prozac prescribed by his PCP provider but stopped taking it a week ago. Pt states he is full time employee and lives with his parents, reports no other concerns. Pt denies hx of abuse/trauma, reports no hx of violence, no access to weapons at this time. Pt states he is open to resources but has no desire to seek inpatient treatment at this time, feels he can keep himself safe with parents support.    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 Suicidal  . Depression  . Alcohol Problem   Visit Diagnosis: alcohol, intoxication, depression  Patient Reported Information How did you hear about [place]? [employer]  Referral name: [employer]  Referral phone number: No data recorded Whom do you see for routine medical problems? I don't have a doctor   Practice/Facility Name: No data recorded  Practice/Facility Phone Number: No data recorded  Name of Contact: No data recorded  Contact Number: No data recorded  Contact Fax Number: No data  recorded  Prescriber Name: No data recorded  Prescriber Address (if known): No data recorded What Is the Reason for Your Visit/Call Today? No data recorded How Long Has This Been Causing You Problems? <Week  Have You Recently Been in Any Inpatient Treatment (Hospital/Detox/Crisis Center/28-Day Program)? No   Name/Location of Program/Hospital:No data recorded  How Long Were You There? No data recorded  When Were You Discharged? No data recorded Have You Ever Received Services From [facility] Before? No   Who Do You See at [facility]? No data recorded Have You Recently Had Any Thoughts About Hurting Yourself? Yes   Are You Planning to Commit Suicide/Harm Yourself At This time?  No  Have you Recently Had Thoughts About Hurting Someone Karolee Ohs? No   Explanation: No data recorded Have You Used Any Alcohol or Drugs in the Past 24 Hours? Yes   How Long Ago Did You Use Drugs or Alcohol?  Earlier tonight  What Did You Use and How Much? 12 beers/marijuana (12 beers/marijuana)  What Do You Feel Would Help You the Most Today? Assessment Only  Do You Currently Have a Therapist/Psychiatrist? No   Name of Therapist/Psychiatrist: No data recorded  Have You Been Recently Discharged From Any Office Practice or Programs? No   Explanation of Discharge From Practice/Program:  No data recorded    CCA Screening Triage Referral Assessment Type of Contact: Face-to-Face   Is this Initial or Reassessment? Initial assessment  Date Telepsych consult ordered in [facility]:  05/04/20  Time Telepsych consult ordered in [facility]:  No  data recorded Patient Reported Information Reviewed? Yes   Patient Left Without Being Seen? No data recorded  Reason for Not Completing Assessment: No data recorded Collateral Involvement: No data recorded Does Patient Have a Court Appointed Legal Guardian? No data recorded  Name and Contact of Legal Guardian:  No data recorded If Minor and Not Living with Parent(s), Who has Custody?  No data recorded Is CPS involved or ever been involved? No data recorded Is APS involved or ever been involved? No data recorded Patient Determined To Be At Risk for Harm To Self or Others Based on Review of Patient Reported Information or Presenting Complaint? No   Method: No data recorded  Availability of Means: No data recorded  Intent: No data recorded  Notification Required: No data recorded  Additional Information for Danger to Others Potential:  No data recorded  Additional Comments for Danger to Others Potential:  No data recorded  Are There Guns or Other Weapons in Your Home?  No data recorded   Types of Guns/Weapons: No data recorded   Are These Weapons Safely Secured?                              No data recorded   Who Could Verify You Are Able To Have These Secured:    No data recorded Do You Have any Outstanding Charges, Pending Court Dates, Parole/Probation? No data recorded Contacted To Inform of Risk of Harm To Self or Others: No data recorded Location of Assessment: [facility] Assessment Services  Does Patient Present under Involuntary Commitment? No   IVC Papers Initial File Date: No data recorded  [place] of Residence: [place]  Patient Currently Receiving the Following Services: Medication Management   Determination of Need: Routine (7 days)   Options For Referral: Outpatient Resources  Natasha Mead, [place]

## 2020-05-04 NOTE — Discharge Instructions (Signed)
Patient will be discharge with resources for substance abuse treatment

## 2020-05-05 ENCOUNTER — Other Ambulatory Visit: Payer: BC Managed Care – PPO

## 2020-05-05 DIAGNOSIS — Z20822 Contact with and (suspected) exposure to covid-19: Secondary | ICD-10-CM

## 2020-05-06 ENCOUNTER — Encounter (HOSPITAL_COMMUNITY): Payer: Self-pay | Admitting: Behavioral Health

## 2020-05-06 ENCOUNTER — Ambulatory Visit (HOSPITAL_COMMUNITY)
Admission: EM | Admit: 2020-05-06 | Discharge: 2020-05-06 | Disposition: A | Discharge status: Home / Self Care | Payer: BC Managed Care – PPO | Attending: Psychiatry | Admitting: Psychiatry

## 2020-05-06 ENCOUNTER — Telehealth (HOSPITAL_COMMUNITY): Payer: Self-pay | Admitting: Licensed Clinical Social Worker

## 2020-05-06 ENCOUNTER — Other Ambulatory Visit: Payer: Self-pay

## 2020-05-06 DIAGNOSIS — F332 Major depressive disorder, recurrent severe without psychotic features: Secondary | ICD-10-CM | POA: Diagnosis not present

## 2020-05-06 DIAGNOSIS — F321 Major depressive disorder, single episode, moderate: Secondary | ICD-10-CM | POA: Diagnosis not present

## 2020-05-06 LAB — NOVEL CORONAVIRUS, NAA: SARS-CoV-2, NAA: NOT DETECTED

## 2020-05-06 LAB — SARS-COV-2, NAA 2 DAY TAT

## 2020-05-06 MED ORDER — VENLAFAXINE HCL ER 37.5 MG PO CP24: 37.5000 mg | ORAL_CAPSULE | Freq: Every day | ORAL | 0 refills | Status: DC

## 2020-05-06 MED ORDER — MIRTAZAPINE 15 MG PO TABS: 15.0000 mg | ORAL_TABLET | Freq: Every day | ORAL | 0 refills | Status: DC
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
## 2020-05-06 NOTE — ED Notes (Signed)
Pt discharged in no acute distress. Verbalized understanding of all discharge instructions reviewed by RN. Denies SI/HI. Escorted to locker to retrieve belongings and then to lobby to self care. Safety maintained.
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
## 2020-05-06 NOTE — ED Provider Notes (Signed)
[facility] Medical Screening Exam  Patient Name: Ricardo Stevens MRN: [id number] Date of Evaluation: 05/06/20 Chief Complaint: Chief Complaint/Presenting Problem: Depression and passive SI Diagnosis:  Final diagnoses:  MDD (major depressive disorder), single episode, moderate (HCC)    History of Present illness: Ricardo Stevens is a 24 y.o. male.  Patient presents voluntarily as a walk-in to the [facility].  Patient reports that he has had some ongoing depression with some passive suicidal ideations with no plan or intent of harming himself.  Patient reports that is been going on for quite some time and states that he has had some relationship issues which is caused the symptoms become worse recently.  He states he has had difficulty with sleep, poor appetite, no motivation, and feeling depressed.  Patient states that he really needs to get some help but is not interested in inpatient treatment.  Patient states he knows he needs to get some help and is interested in medications and the PHP program. Patient reports that he has Prozac, he thinks, a while ago but noticed no changes with it. He reports that he does not want to kill himself and he lives with his parents and they are very supportive and he is employed full-time through the state. Patient states that he would like a medication to assist with sleep, appetite, depression, and motivation. We discuss Effexor XR and Remeron. Patient states he would like to try them and will follow up with outpatient resources. SW is assisting in referring the patient to PHP. Medications were e-prescribed to pharmacy of choice.   Psychiatric Specialty Exam  Presentation  General Appearance:Appropriate for Environment;Casual;Well Groomed  Eye Contact:Good  Speech:Clear and Coherent;Normal Rate  Speech Volume:Normal  Handedness:Right   Mood and Affect  Mood:Depressed  Affect:Appropriate;Congruent   Thought Process  Thought  Processes:Coherent  Descriptions of Associations:Intact  Orientation:Full (Time, Place and Person)  Thought Content:WDL  Hallucinations:None  Ideas of Reference:None  Suicidal Thoughts:Yes, Passive Without Intent;Without Plan  Homicidal Thoughts:No   Sensorium  Memory:Immediate Good;Recent Good;Remote Good  Judgment:Intact  Insight:Good   Executive Functions  Concentration:Good  Attention Span:Good  Recall:Good  Fund of Knowledge:Good  Language:Good   Psychomotor Activity  Psychomotor Activity:Normal   Assets  Assets:Communication Skills;Desire for Improvement;Financial Resources/Insurance;Housing;Physical Health;Social Support;Transportation   Sleep  Sleep:Fair  Number of hours: 3   Physical Exam: Physical Exam Vitals and nursing note reviewed.  Constitutional:      Appearance: He is well-developed.  HENT:     Head: Normocephalic.  Eyes:     Pupils: Pupils are equal, round, and reactive to light.  Cardiovascular:     Rate and Rhythm: Normal rate.  Pulmonary:     Effort: Pulmonary effort is normal.  Musculoskeletal:        General: Normal range of motion.  Neurological:     Mental Status: He is alert and oriented to person, place, and time.    Review of Systems  Constitutional: Negative.   HENT: Negative.   Eyes: Negative.   Respiratory: Negative.   Cardiovascular: Negative.   Gastrointestinal: Negative.   Genitourinary: Negative.   Musculoskeletal: Negative.   Skin: Negative.   Neurological: Negative.   Endo/Heme/Allergies: Negative.   Psychiatric/Behavioral: Positive for depression and substance abuse.   Blood pressure (!) 146/102, pulse 78, temperature 98.2 F (36.8 C), temperature source Oral, resp. rate 18, height 5\' 7"  (1.702 m), weight 154 lb (69.9 kg), SpO2 100 %. Body mass index is 24.12 kg/m.  Musculoskeletal: Strength &  Muscle Tone: within normal limits Gait & Station: normal Patient leans: N/A   [facility] MSE  Discharge Disposition for Follow up and Recommendations: Based on my evaluation the patient does not appear to have an emergency medical condition and can be discharged with resources and follow up care in outpatient services for Medication Management, Partial Hospitalization Program and Individual Therapy   Maryfrances Bunnell, FNP 05/06/2020, 10:22 AM

## 2020-05-06 NOTE — Discharge Instructions (Addendum)

## 2020-05-12 ENCOUNTER — Other Ambulatory Visit: Payer: BC Managed Care – PPO

## 2020-05-12 DIAGNOSIS — Z20822 Contact with and (suspected) exposure to covid-19: Secondary | ICD-10-CM

## 2020-05-13 LAB — SARS-COV-2, NAA 2 DAY TAT

## 2020-05-13 LAB — NOVEL CORONAVIRUS, NAA: SARS-CoV-2, NAA: NOT DETECTED

## 2020-05-19 ENCOUNTER — Other Ambulatory Visit: Payer: BC Managed Care – PPO

## 2020-05-19 DIAGNOSIS — Z20822 Contact with and (suspected) exposure to covid-19: Secondary | ICD-10-CM

## 2020-05-20 LAB — SARS-COV-2, NAA 2 DAY TAT

## 2020-05-20 LAB — NOVEL CORONAVIRUS, NAA: SARS-CoV-2, NAA: NOT DETECTED

## 2020-05-21 ENCOUNTER — Ambulatory Visit (INDEPENDENT_AMBULATORY_CARE_PROVIDER_SITE_OTHER): Payer: BC Managed Care – PPO | Admitting: Nurse Practitioner

## 2020-05-21 ENCOUNTER — Encounter: Payer: Self-pay | Admitting: Nurse Practitioner

## 2020-05-21 ENCOUNTER — Other Ambulatory Visit: Payer: Self-pay

## 2020-05-21 VITALS — BP 123/82 | HR 80 | Temp 98.5°F | Resp 16 | Ht 66.0 in | Wt 157.0 lb

## 2020-05-21 DIAGNOSIS — Z0000 Encounter for general adult medical examination without abnormal findings: Secondary | ICD-10-CM | POA: Diagnosis not present

## 2020-05-21 DIAGNOSIS — Z7251 High risk heterosexual behavior: Secondary | ICD-10-CM

## 2020-05-21 DIAGNOSIS — F339 Major depressive disorder, recurrent, unspecified: Secondary | ICD-10-CM | POA: Diagnosis not present

## 2020-05-21 NOTE — Progress Notes (Signed)
New Patient Office Visit  Subjective:  Patient ID: Ricardo Stevens, male    DOB: March 28, 1996  Age: 24 y.o. MRN: [id number]  CC:  Chief Complaint  Patient presents with  . New Patient (Initial Visit)    HPI Ricardo Stevens presents for new patient visit.  Previous PCP was Dr. Tracie Harrier. Last physical and labs were drawn over a year ago.  No acute issues today. He states he would like to be tested for "everything".  Past Medical History:  Diagnosis Date  . ADHD (attention deficit hyperactivity disorder)   . Asthma    childhood, no recent issues  . Concussion   . Head injury, acute, without loss of consciousness   . Mononucleosis 07/2017    Past Surgical History:  Procedure Laterality Date  . ADENOIDECTOMY    . BIOPSY  07/14/2018   Procedure: BIOPSY;  Surgeon: West Bali, MD;  Location: [facility];  Service: Endoscopy;;  . CIRCUMCISION    . ESOPHAGOGASTRODUODENOSCOPY N/A 07/14/2018   Dr. Darrick Penna: Gastritis/duodenitis.  Small bowel biopsies benign, gastric biopsies with gastritis but no H. pylori.  . TONSILLECTOMY    . TYMPANOSTOMY TUBE PLACEMENT Bilateral   . WISDOM TOOTH EXTRACTION      Family History  Problem Relation Age of Onset  . Hypertension Mother   . Diabetes Maternal Grandmother   . Diabetes Maternal Grandfather   . Diabetes Paternal Grandmother   . Breast cancer Paternal Grandmother   . Diabetes Paternal Grandfather     Social History   Socioeconomic History  . Marital status: Single    Spouse name: Not on file  . Number of children: Not on file  . Years of education: Not on file  . Highest education level: Not on file  Occupational History  . Occupation: Midwife    Comment: [employer]  Tobacco Use  . Smoking status: Former Smoker    Packs/day: 0.50    Years: 2.00    Pack years: 1.00    Types: Cigarettes    Quit date: 03/28/2020    Years since quitting: 0.1  . Smokeless tobacco: Never Used  Vaping Use  . Vaping Use: Never used  Substance and  Sexual Activity  . Alcohol use: Yes    Comment: 4 beers per day  . Drug use: Yes    Types: Marijuana    Comment: last saturday; not a regular occurance   . Sexual activity: Yes    Birth control/protection: Condom  Other Topics Concern  . Not on file  Social History Narrative   Lives at home with Mom, Dad, and Sister. 3 dogs. Mom & Dad smoke outside, not in front of kids.   Social Determinants of Health   Financial Resource Strain:   . Difficulty of Paying Living Expenses: Not on file  Food Insecurity:   . Worried About Programme researcher, broadcasting/film/video in the Last Year: Not on file  . Ran Out of Food in the Last Year: Not on file  Transportation Needs:   . Lack of Transportation (Medical): Not on file  . Lack of Transportation (Non-Medical): Not on file  Physical Activity:   . Days of Exercise per Week: Not on file  . Minutes of Exercise per Session: Not on file  Stress:   . Feeling of Stress : Not on file  Social Connections:   . Frequency of Communication with Friends and Family: Not on file  . Frequency of Social Gatherings with Friends and Family: Not on  file  . Attends Religious Services: Not on file  . Active Member of Clubs or Organizations: Not on file  . Attends Banker Meetings: Not on file  . Marital Status: Not on file  Intimate Partner Violence:   . Fear of Current or Ex-Partner: Not on file  . Emotionally Abused: Not on file  . Physically Abused: Not on file  . Sexually Abused: Not on file    ROS Review of Systems  Constitutional: Negative for fatigue and fever.  Respiratory: Negative for cough, shortness of breath and wheezing.   Cardiovascular: Negative for chest pain and palpitations.  Gastrointestinal: Negative for abdominal pain.  Genitourinary: Negative.   Psychiatric/Behavioral: Negative for suicidal ideas. The patient is not nervous/anxious.     Objective:   Today's Vitals: There were no vitals taken for this visit.  Physical  Exam Constitutional:      Appearance: Normal appearance.  Cardiovascular:     Rate and Rhythm: Normal rate and regular rhythm.     Pulses: Normal pulses.     Heart sounds: Normal heart sounds.  Pulmonary:     Effort: Pulmonary effort is normal.     Breath sounds: Normal breath sounds.  Musculoskeletal:        General: Normal range of motion.  Neurological:     General: No focal deficit present.     Mental Status: He is alert and oriented to person, place, and time.  Psychiatric:        Mood and Affect: Mood normal.        Behavior: Behavior normal.     Assessment & Plan:   Problem List Items Addressed This Visit    None    Visit Diagnoses    Depression, recurrent (HCC)    -  Primary   High risk heterosexual behavior          Outpatient Encounter Medications as of 05/21/2020  Medication Sig  . mirtazapine (REMERON) 15 MG tablet Take 1 tablet (15 mg total) by mouth at bedtime.  Marland Kitchen venlafaxine XR (EFFEXOR XR) 37.5 MG 24 hr capsule Take 1 capsule (37.5 mg total) by mouth daily.   No facility-administered encounter medications on file as of 05/21/2020.   Depression -followed by psychiatry, but he cannot remember his name -takes mirtazapine and effexor -recent start on his medications and with psychiatry -he state he is doing well  High risk sexual behavior -he would like STD testing today -will collect tests   Follow-up: Return in about 1 week (around 05/28/2020) for Annual Physical Exam.   Heather Roberts, NP

## 2020-05-23 LAB — GC/CHLAMYDIA PROBE AMP
Chlamydia trachomatis, NAA: NEGATIVE
Neisseria Gonorrhoeae by PCR: NEGATIVE

## 2020-05-25 LAB — CMP14+EGFR
ALT: 19 IU/L (ref 0–44)
AST: 24 IU/L (ref 0–40)
Albumin/Globulin Ratio: 2.3 — ABNORMAL HIGH (ref 1.2–2.2)
Albumin: 5.3 g/dL — ABNORMAL HIGH (ref 4.1–5.2)
Alkaline Phosphatase: 90 IU/L (ref 44–121)
BUN/Creatinine Ratio: 18 (ref 9–20)
BUN: 16 mg/dL (ref 6–20)
Bilirubin Total: 0.3 mg/dL (ref 0.0–1.2)
CO2: 23 mmol/L (ref 20–29)
Calcium: 9.8 mg/dL (ref 8.7–10.2)
Chloride: 101 mmol/L (ref 96–106)
Creatinine, Ser: 0.87 mg/dL (ref 0.76–1.27)
GFR calc Af Amer: 142 mL/min/{1.73_m2} (ref >59)
GFR calc non Af Amer: 123 mL/min/{1.73_m2} (ref >59)
Globulin, Total: 2.3 g/dL (ref 1.5–4.5)
Glucose: 86 mg/dL (ref 65–99)
Potassium: 4.7 mmol/L (ref 3.5–5.2)
Sodium: 141 mmol/L (ref 134–144)
Total Protein: 7.6 g/dL (ref 6.0–8.5)

## 2020-05-25 LAB — HEPB+HEPC+HIV PANEL
HIV Screen 4th Generation wRfx: NONREACTIVE
Hep B C IgM: NEGATIVE
Hep B Core Total Ab: NEGATIVE
Hep B E Ab: NEGATIVE
Hep B E Ag: NEGATIVE
Hep B Surface Ab, Qual: REACTIVE
Hep C Virus Ab: <0.1 s/co ratio (ref 0.0–0.9)
Hepatitis B Surface Ag: NEGATIVE

## 2020-05-25 LAB — CBC WITH DIFFERENTIAL/PLATELET
Basophils Absolute: 0.1 10*3/uL (ref 0.0–0.2)
Basos: 1 %
EOS (ABSOLUTE): 0.2 10*3/uL (ref 0.0–0.4)
Eos: 2 %
Hematocrit: 47.2 % (ref 37.5–51.0)
Hemoglobin: 15.7 g/dL (ref 13.0–17.7)
Immature Grans (Abs): 0 10*3/uL (ref 0.0–0.1)
Immature Granulocytes: 0 %
Lymphocytes Absolute: 2.8 10*3/uL (ref 0.7–3.1)
Lymphs: 40 %
MCH: 30.4 pg (ref 26.6–33.0)
MCHC: 33.3 g/dL (ref 31.5–35.7)
MCV: 92 fL (ref 79–97)
Monocytes Absolute: 0.4 10*3/uL (ref 0.1–0.9)
Monocytes: 6 %
Neutrophils Absolute: 3.6 10*3/uL (ref 1.4–7.0)
Neutrophils: 51 %
Platelets: 339 10*3/uL (ref 150–450)
RBC: 5.16 x10E6/uL (ref 4.14–5.80)
RDW: 12.4 % (ref 11.6–15.4)
WBC: 7.1 10*3/uL (ref 3.4–10.8)

## 2020-05-25 LAB — LIPID PANEL WITH LDL/HDL RATIO
Cholesterol, Total: 176 mg/dL (ref 100–199)
HDL: 73 mg/dL (ref >39)
LDL Chol Calc (NIH): 93 mg/dL (ref 0–99)
LDL/HDL Ratio: 1.3 ratio (ref 0.0–3.6)
Triglycerides: 52 mg/dL (ref 0–149)
VLDL Cholesterol Cal: 10 mg/dL (ref 5–40)

## 2020-05-25 LAB — RPR: RPR Ser Ql: NONREACTIVE

## 2020-05-26 ENCOUNTER — Other Ambulatory Visit: Payer: BC Managed Care – PPO

## 2020-05-26 DIAGNOSIS — Z20822 Contact with and (suspected) exposure to covid-19: Secondary | ICD-10-CM

## 2020-05-27 LAB — SARS-COV-2, NAA 2 DAY TAT

## 2020-05-27 LAB — NOVEL CORONAVIRUS, NAA: SARS-CoV-2, NAA: NOT DETECTED

## 2020-05-28 ENCOUNTER — Ambulatory Visit (INDEPENDENT_AMBULATORY_CARE_PROVIDER_SITE_OTHER): Payer: BC Managed Care – PPO | Admitting: Nurse Practitioner

## 2020-05-28 ENCOUNTER — Other Ambulatory Visit: Payer: Self-pay

## 2020-05-28 ENCOUNTER — Encounter: Payer: Self-pay | Admitting: Nurse Practitioner

## 2020-05-28 VITALS — BP 146/84 | HR 85 | Temp 98.3°F | Resp 20 | Ht 67.0 in | Wt 158.0 lb

## 2020-05-28 DIAGNOSIS — F332 Major depressive disorder, recurrent severe without psychotic features: Secondary | ICD-10-CM

## 2020-05-28 DIAGNOSIS — Z7251 High risk heterosexual behavior: Secondary | ICD-10-CM | POA: Diagnosis not present

## 2020-05-28 NOTE — Assessment & Plan Note (Signed)
-  followed by psychiatry, but he cannot remember his name; has first appointment next week -takes mirtazapine and effexor; feels like this is working well -recent start on his medications and with psychiatry  -no SI, HI, or psychosis

## 2020-05-28 NOTE — Assessment & Plan Note (Signed)
-  STD testing was negative for syphilis, HIV, HBV, HCV, GC, and CL

## 2020-05-28 NOTE — Patient Instructions (Signed)
It was great seeing you again today.  Your physical exam was great.  Follow-up with your psychiatrist in a week.  We will see you again in a year for a repeat physical, but we are here if you have any acute issues.

## 2020-05-28 NOTE — Progress Notes (Signed)
New Patient Office Visit  Subjective:  Patient ID: Ricardo Stevens, male    DOB: April 27, 1996  Age: 24 y.o. MRN: [id number]  CC:  Chief Complaint  Patient presents with  . Annual Exam    HPI Ricardo Stevens presents for annual physical exam.  No acute issues today.   Past Medical History:  Diagnosis Date  . ADHD (attention deficit hyperactivity disorder)   . Asthma    childhood, no recent issues  . Cellulitis and abscess of foot 10/10/2014  . Cellulitis of leg, left 10/09/2014  . Concussion   . Epigastric pain 05/12/2018  . Head injury, acute, without loss of consciousness   . Mononucleosis 07/2017  . Non-intractable vomiting 05/12/2018  . Splenomegaly 08/18/2017    Past Surgical History:  Procedure Laterality Date  . ADENOIDECTOMY    . BIOPSY  07/14/2018   Procedure: BIOPSY;  Surgeon: West Bali, MD;  Location: [facility];  Service: Endoscopy;;  . CIRCUMCISION    . ESOPHAGOGASTRODUODENOSCOPY N/A 07/14/2018   Dr. Darrick Penna: Gastritis/duodenitis.  Small bowel biopsies benign, gastric biopsies with gastritis but no H. pylori.  . TONSILLECTOMY    . TYMPANOSTOMY TUBE PLACEMENT Bilateral   . WISDOM TOOTH EXTRACTION      Family History  Problem Relation Age of Onset  . Hypertension Mother   . Diabetes Maternal Grandmother   . Diabetes Maternal Grandfather   . Diabetes Paternal Grandmother   . Breast cancer Paternal Grandmother   . Diabetes Paternal Grandfather     Social History   Socioeconomic History  . Marital status: Single    Spouse name: Not on file  . Number of children: Not on file  . Years of education: Not on file  . Highest education level: Not on file  Occupational History  . Occupation: Midwife    Comment: [employer]  Tobacco Use  . Smoking status: Former Smoker    Packs/day: 0.50    Years: 2.00    Pack years: 1.00    Types: Cigarettes    Quit date: 03/28/2020    Years since quitting: 0.1  . Smokeless tobacco: Never Used  Vaping Use  . Vaping Use:  Never used  Substance and Sexual Activity  . Alcohol use: Yes    Comment: 4 beers per day  . Drug use: Yes    Types: Marijuana    Comment: last saturday; not a regular occurance   . Sexual activity: Yes    Birth control/protection: Condom  Other Topics Concern  . Not on file  Social History Narrative   Lives at home with Mom, Dad, and Sister. 3 dogs. Mom & Dad smoke outside, not in front of kids.   Social Determinants of Health   Financial Resource Strain:   . Difficulty of Paying Living Expenses: Not on file  Food Insecurity:   . Worried About Programme researcher, broadcasting/film/video in the Last Year: Not on file  . Ran Out of Food in the Last Year: Not on file  Transportation Needs:   . Lack of Transportation (Medical): Not on file  . Lack of Transportation (Non-Medical): Not on file  Physical Activity:   . Days of Exercise per Week: Not on file  . Minutes of Exercise per Session: Not on file  Stress:   . Feeling of Stress : Not on file  Social Connections:   . Frequency of Communication with Friends and Family: Not on file  . Frequency of Social Gatherings with Friends and Family:  Not on file  . Attends Religious Services: Not on file  . Active Member of Clubs or Organizations: Not on file  . Attends Banker Meetings: Not on file  . Marital Status: Not on file  Intimate Partner Violence:   . Fear of Current or Ex-Partner: Not on file  . Emotionally Abused: Not on file  . Physically Abused: Not on file  . Sexually Abused: Not on file    ROS Review of Systems  Constitutional: Negative.   HENT: Negative.   Eyes: Negative.   Respiratory: Negative.   Cardiovascular: Negative.   Gastrointestinal: Negative.   Endocrine: Negative.   Genitourinary: Negative.   Musculoskeletal: Negative.   Neurological: Negative.   Psychiatric/Behavioral: Negative for agitation, behavioral problems, dysphoric mood, hallucinations, self-injury and suicidal ideas. The patient is not  nervous/anxious.     Objective:   Today's Vitals: BP (!) 146/84   Pulse 85   Temp 98.3 F (36.8 C)   Resp 20   Ht 5\' 7"  (1.702 m)   Wt 158 lb (71.7 kg)   SpO2 96%   BMI 24.75 kg/m   Physical Exam Constitutional:      Appearance: Normal appearance.  HENT:     Head: Normocephalic and atraumatic.     Right Ear: Tympanic membrane, ear canal and external ear normal.     Left Ear: Tympanic membrane, ear canal and external ear normal.     Nose: Nose normal.     Mouth/Throat:     Mouth: Mucous membranes are moist.     Pharynx: Oropharynx is clear.  Eyes:     Extraocular Movements: Extraocular movements intact.     Conjunctiva/sclera: Conjunctivae normal.     Pupils: Pupils are equal, round, and reactive to light.  Cardiovascular:     Rate and Rhythm: Normal rate and regular rhythm.     Pulses: Normal pulses.     Heart sounds: Normal heart sounds.  Pulmonary:     Effort: Pulmonary effort is normal.     Breath sounds: Normal breath sounds.  Abdominal:     General: Abdomen is flat. Bowel sounds are normal.     Palpations: Abdomen is soft.  Musculoskeletal:        General: Normal range of motion.     Cervical back: Normal range of motion and neck supple.  Skin:    General: Skin is warm and dry.     Capillary Refill: Capillary refill takes less than 2 seconds.  Neurological:     General: No focal deficit present.     Mental Status: He is alert and oriented to person, place, and time.  Psychiatric:        Mood and Affect: Mood normal.        Behavior: Behavior normal.        Thought Content: Thought content normal.        Judgment: Judgment normal.     Assessment & Plan:   Problem List Items Addressed This Visit      Other   Severe episode of recurrent major depressive disorder, without psychotic features (HCC) - Primary    -followed by psychiatry, but he cannot remember his name; has first appointment next week -takes mirtazapine and effexor; feels like this is  working well -recent start on his medications and with psychiatry  -no SI, HI, or psychosis      High risk sexual behavior    -STD testing was negative for syphilis, HIV, HBV, HCV, GC, and  CL         Outpatient Encounter Medications as of 05/28/2020  Medication Sig  . mirtazapine (REMERON) 15 MG tablet Take 1 tablet (15 mg total) by mouth at bedtime.  Marland Kitchen venlafaxine XR (EFFEXOR XR) 37.5 MG 24 hr capsule Take 1 capsule (37.5 mg total) by mouth daily.   No facility-administered encounter medications on file as of 05/28/2020.     Follow-up: Return in about 1 year (around 05/28/2021) for Physical Exam, no labs due to age.   Heather Roberts, NP

## 2020-06-02 ENCOUNTER — Other Ambulatory Visit: Payer: BC Managed Care – PPO

## 2020-06-02 DIAGNOSIS — Z20822 Contact with and (suspected) exposure to covid-19: Secondary | ICD-10-CM

## 2020-06-04 LAB — SARS-COV-2, NAA 2 DAY TAT

## 2020-06-04 LAB — NOVEL CORONAVIRUS, NAA: SARS-CoV-2, NAA: NOT DETECTED

## 2020-06-06 ENCOUNTER — Other Ambulatory Visit: Payer: Self-pay

## 2020-06-06 ENCOUNTER — Ambulatory Visit (INDEPENDENT_AMBULATORY_CARE_PROVIDER_SITE_OTHER): Payer: BC Managed Care – PPO | Admitting: *Deleted

## 2020-06-06 DIAGNOSIS — Z23 Encounter for immunization: Secondary | ICD-10-CM | POA: Diagnosis not present

## 2020-06-06 NOTE — Progress Notes (Signed)
   Covid-19 Vaccination Clinic  Name:  Ricardo Stevens    MRN: [id number] DOB: 10/28/1995  06/06/2020  [name] was observed post Covid-19 immunization for 15 minutes without incident. He was provided with Vaccine Information Sheet and instruction to access the V-Safe system.   [name] was instructed to call 911 with any severe reactions post vaccine: Marland Kitchen Difficulty breathing  . Swelling of face and throat  . A fast heartbeat  . A bad rash all over body  . Dizziness and weakness   Immunizations Administered    Name Date Dose VIS Date Route   Pfizer COVID-19 Vaccine 06/06/2020 11:02 AM 0.3 mL 04/16/2020 Intramuscular   Manufacturer: [employer], [employer]   Lot: [id number]   NDC: [id number]

## 2020-06-09 ENCOUNTER — Other Ambulatory Visit: Payer: Self-pay

## 2020-06-09 ENCOUNTER — Other Ambulatory Visit: Payer: BC Managed Care – PPO

## 2020-06-09 ENCOUNTER — Encounter: Payer: Self-pay | Admitting: Nurse Practitioner

## 2020-06-09 ENCOUNTER — Telehealth (INDEPENDENT_AMBULATORY_CARE_PROVIDER_SITE_OTHER): Payer: BC Managed Care – PPO | Admitting: Nurse Practitioner

## 2020-06-09 DIAGNOSIS — F339 Major depressive disorder, recurrent, unspecified: Secondary | ICD-10-CM | POA: Insufficient documentation

## 2020-06-09 DIAGNOSIS — H9201 Otalgia, right ear: Secondary | ICD-10-CM | POA: Insufficient documentation

## 2020-06-09 DIAGNOSIS — Z20822 Contact with and (suspected) exposure to covid-19: Secondary | ICD-10-CM

## 2020-06-09 MED ORDER — FLUTICASONE PROPIONATE 50 MCG/ACT NA SUSP
2.0000 | Freq: Every day | NASAL | 6 refills | Status: DC
Start: 2020-06-09 — End: 2021-06-25

## 2020-06-09 MED ORDER — VENLAFAXINE HCL ER 37.5 MG PO CP24: 37.5000 mg | ORAL_CAPSULE | Freq: Every day | ORAL | 0 refills | Status: DC

## 2020-06-09 NOTE — Assessment & Plan Note (Signed)
-  given his MOI, likely related to Eustachian tube disorder/barotrauma; no likely rupture of TM -Rx. flonase -f/u in 203 days if no improvement, will need physical exam at that point to evaluate integrity of TM

## 2020-06-09 NOTE — Assessment & Plan Note (Addendum)
-  has been doing much better since he was hospitalized -Refilled effexor -med check in 1 month

## 2020-06-09 NOTE — Progress Notes (Signed)
Acute Office Visit  Subjective:    Patient ID: Ricardo Stevens, male    DOB: 1995-08-20, 24 y.o.   MRN: [id number]  Chief Complaint  Patient presents with   Otalgia    Pressure and pain on the R ear x 2 days     HPI Patient is in today for acute right otalgia.  He states that he went snowboarding yesterday and his ears never popped when coming down the mountain. His right ear has been hurting ever since. No change in hearing and no drainage.  He also states that he needs a refill on his effexor.  Past Medical History:  Diagnosis Date   ADHD (attention deficit hyperactivity disorder)    Asthma    childhood, no recent issues   Cellulitis and abscess of foot 10/10/2014   Cellulitis of leg, left 10/09/2014   Concussion    Epigastric pain 05/12/2018   Head injury, acute, without loss of consciousness    Mononucleosis 07/2017   Non-intractable vomiting 05/12/2018   Splenomegaly 08/18/2017    Past Surgical History:  Procedure Laterality Date   ADENOIDECTOMY     BIOPSY  07/14/2018   Procedure: BIOPSY;  Surgeon: West Bali, MD;  Location: [facility];  Service: Endoscopy;;   CIRCUMCISION     ESOPHAGOGASTRODUODENOSCOPY N/A 07/14/2018   Dr. Darrick Penna: Gastritis/duodenitis.  Small bowel biopsies benign, gastric biopsies with gastritis but no H. pylori.   TONSILLECTOMY     TYMPANOSTOMY TUBE PLACEMENT Bilateral    WISDOM TOOTH EXTRACTION      Family History  Problem Relation Age of Onset   Hypertension Mother    Diabetes Maternal Grandmother    Diabetes Maternal Grandfather    Diabetes Paternal Grandmother    Breast cancer Paternal Grandmother    Diabetes Paternal Grandfather     Social History   Socioeconomic History   Marital status: Single    Spouse name: Not on file   Number of children: Not on file   Years of education: Not on file   Highest education level: Not on file  Occupational History   Occupation: Midwife    Comment: [employer]   Tobacco Use   Smoking status: Former Smoker    Packs/day: 0.50    Years: 2.00    Pack years: 1.00    Types: Cigarettes    Quit date: 03/28/2020    Years since quitting: 0.2   Smokeless tobacco: Never Used  Vaping Use   Vaping Use: Never used  Substance and Sexual Activity   Alcohol use: Yes    Comment: 4 beers per day   Drug use: Yes    Types: Marijuana    Comment: last saturday; not a regular occurance    Sexual activity: Yes    Birth control/protection: Condom  Other Topics Concern   Not on file  Social History Narrative   Lives at home with Mom, Dad, and Sister. 3 dogs. Mom & Dad smoke outside, not in front of kids.   Social Determinants of Health   Financial Resource Strain: Not on file  Food Insecurity: Not on file  Transportation Needs: Not on file  Physical Activity: Not on file  Stress: Not on file  Social Connections: Not on file  Intimate Partner Violence: Not on file    Outpatient Medications Prior to Visit  Medication Sig Dispense Refill   mirtazapine (REMERON) 15 MG tablet Take 1 tablet (15 mg total) by mouth at bedtime. 30 tablet 0   venlafaxine XR (  EFFEXOR XR) 37.5 MG 24 hr capsule Take 1 capsule (37.5 mg total) by mouth daily. 30 capsule 0   No facility-administered medications prior to visit.    No Known Allergies  Review of Systems  Constitutional: Negative.   HENT: Positive for ear pain. Negative for congestion, sinus pressure, sinus pain and sore throat.   Psychiatric/Behavioral: Positive for dysphoric mood. Negative for self-injury and suicidal ideas.       Improved mood with effexor, has been out for about a week and needs refill.       Objective:    Physical Exam  There were no vitals taken for this visit. Wt Readings from Last 3 Encounters:  05/28/20 158 lb (71.7 kg)  05/21/20 157 lb (71.2 kg)  07/14/18 180 lb (81.6 kg)    Health Maintenance Due  Topic Date Due   INFLUENZA VACCINE  Never done    There are no  preventive care reminders to display for this patient.   No results found for: TSH Lab Results  Component Value Date   WBC 7.1 05/21/2020   HGB 15.7 05/21/2020   HCT 47.2 05/21/2020   MCV 92 05/21/2020   PLT 339 05/21/2020   Lab Results  Component Value Date   NA 141 05/21/2020   K 4.7 05/21/2020   CO2 23 05/21/2020   GLUCOSE 86 05/21/2020   BUN 16 05/21/2020   CREATININE 0.87 05/21/2020   BILITOT 0.3 05/21/2020   ALKPHOS 90 05/21/2020   AST 24 05/21/2020   ALT 19 05/21/2020   PROT 7.6 05/21/2020   ALBUMIN 5.3 (H) 05/21/2020   CALCIUM 9.8 05/21/2020   ANIONGAP 11 10/17/2016   Lab Results  Component Value Date   CHOL 176 05/21/2020   Lab Results  Component Value Date   HDL 73 05/21/2020   Lab Results  Component Value Date   LDLCALC 93 05/21/2020   Lab Results  Component Value Date   TRIG 52 05/21/2020   No results found for: CHOLHDL No results found for: EXNT7G     Assessment & Plan:   Problem List Items Addressed This Visit      Other   Otalgia of right ear    -given his MOI, likely related to Eustachian tube disorder/barotrauma; no likely rupture of TM -Rx. flonase -f/u in 203 days if no improvement, will need physical exam at that point to evaluate integrity of TM      Depression, recurrent (HCC) - Primary    -has been doing much better since he was hospitalized -Refilled effexor -med check in 1 month          No orders of the defined types were placed in this encounter.  Date:  06/09/2020   Location of Patient: Home Location of Provider: Office Consent was obtain for visit to be over via telehealth. I verified that I am speaking with the correct person using two identifiers.  I connected with  Ricardo Stevens on 06/09/20 via telephone and verified that I am speaking with the correct person using two identifiers.   I discussed the limitations of evaluation and management by telemedicine. The patient expressed understanding and agreed to  proceed.  Time spent: 9 minutes   Heather Roberts, NP

## 2020-06-11 LAB — NOVEL CORONAVIRUS, NAA: SARS-CoV-2, NAA: NOT DETECTED

## 2020-06-11 LAB — SARS-COV-2, NAA 2 DAY TAT

## 2020-06-16 ENCOUNTER — Other Ambulatory Visit: Payer: BC Managed Care – PPO

## 2020-06-16 DIAGNOSIS — Z20822 Contact with and (suspected) exposure to covid-19: Secondary | ICD-10-CM

## 2020-06-17 LAB — NOVEL CORONAVIRUS, NAA: SARS-CoV-2, NAA: NOT DETECTED

## 2020-06-17 LAB — SARS-COV-2, NAA 2 DAY TAT

## 2020-07-01 ENCOUNTER — Other Ambulatory Visit: Payer: Self-pay | Admitting: Nurse Practitioner

## 2020-07-16 ENCOUNTER — Encounter: Payer: Self-pay | Admitting: Nurse Practitioner

## 2020-07-16 ENCOUNTER — Other Ambulatory Visit: Payer: Self-pay

## 2020-07-16 ENCOUNTER — Telehealth (INDEPENDENT_AMBULATORY_CARE_PROVIDER_SITE_OTHER): Payer: BC Managed Care – PPO | Admitting: Nurse Practitioner

## 2020-07-16 DIAGNOSIS — F339 Major depressive disorder, recurrent, unspecified: Secondary | ICD-10-CM | POA: Diagnosis not present

## 2020-07-16 MED ORDER — VENLAFAXINE HCL ER 37.5 MG PO CP24: 37.5000 mg | ORAL_CAPSULE | Freq: Every day | ORAL | 3 refills | Status: DC

## 2020-07-16 NOTE — Patient Instructions (Signed)
We will keep the effexor at the same dose.  Follow-up if you have any changes related to depression. Please continue taking your medications as prescribed.  We will meet back up in December for a physical exam. Please get fasting labs 2-3 days prior to your appointment for a physical.

## 2020-07-16 NOTE — Assessment & Plan Note (Signed)
continue effexor

## 2020-07-16 NOTE — Progress Notes (Signed)
Acute Office Visit  Subjective:    Patient ID: Ricardo Stevens, male    DOB: 07/08/1995, 25 y.o.   MRN: [id number]  Chief Complaint  Patient presents with  . Follow-up  . Depression    HPI Patient is in today for medication check. He was started on effexor about 2 months ago. He states he got a little emotional and was tearful after watching TV.  Past Medical History:  Diagnosis Date  . ADHD (attention deficit hyperactivity disorder)   . Asthma    childhood, no recent issues  . Cellulitis and abscess of foot 10/10/2014  . Cellulitis of leg, left 10/09/2014  . Concussion   . Epigastric pain 05/12/2018  . Head injury, acute, without loss of consciousness   . Mononucleosis 07/2017  . Non-intractable vomiting 05/12/2018  . Splenomegaly 08/18/2017    Past Surgical History:  Procedure Laterality Date  . ADENOIDECTOMY    . BIOPSY  07/14/2018   Procedure: BIOPSY;  Surgeon: West Bali, MD;  Location: [facility];  Service: Endoscopy;;  . CIRCUMCISION    . ESOPHAGOGASTRODUODENOSCOPY N/A 07/14/2018   Dr. Darrick Penna: Gastritis/duodenitis.  Small bowel biopsies benign, gastric biopsies with gastritis but no H. pylori.  . TONSILLECTOMY    . TYMPANOSTOMY TUBE PLACEMENT Bilateral   . WISDOM TOOTH EXTRACTION      Family History  Problem Relation Age of Onset  . Hypertension Mother   . Diabetes Maternal Grandmother   . Diabetes Maternal Grandfather   . Diabetes Paternal Grandmother   . Breast cancer Paternal Grandmother   . Diabetes Paternal Grandfather     Social History   Socioeconomic History  . Marital status: Single    Spouse name: Not on file  . Number of children: Not on file  . Years of education: Not on file  . Highest education level: Not on file  Occupational History  . Occupation: Midwife    Comment: [employer]  Tobacco Use  . Smoking status: Former Smoker    Packs/day: 0.50    Years: 2.00    Pack years: 1.00    Types: Cigarettes    Quit date: 03/28/2020     Years since quitting: 0.3  . Smokeless tobacco: Never Used  Vaping Use  . Vaping Use: Never used  Substance and Sexual Activity  . Alcohol use: Yes    Comment: 4 beers per day  . Drug use: Yes    Types: Marijuana    Comment: last saturday; not a regular occurance   . Sexual activity: Yes    Birth control/protection: Condom  Other Topics Concern  . Not on file  Social History Narrative   Lives at home with Mom, Dad, and Sister. 3 dogs. Mom & Dad smoke outside, not in front of kids.   Social Determinants of Health   Financial Resource Strain: Not on file  Food Insecurity: Not on file  Transportation Needs: Not on file  Physical Activity: Not on file  Stress: Not on file  Social Connections: Not on file  Intimate Partner Violence: Not on file    Outpatient Medications Prior to Visit  Medication Sig Dispense Refill  . fluticasone (FLONASE) 50 MCG/ACT nasal spray Place 2 sprays into both nostrils daily. 16 g 6  . venlafaxine XR (EFFEXOR-XR) 37.5 MG 24 hr capsule TAKE 1 CAPSULE BY MOUTH EVERY DAY 90 capsule 1  . mirtazapine (REMERON) 15 MG tablet Take 1 tablet (15 mg total) by mouth at bedtime. 30 tablet 0  No facility-administered medications prior to visit.    No Known Allergies  Review of Systems  Constitutional: Negative.   Respiratory: Negative.   Cardiovascular: Negative.   Psychiatric/Behavioral: Negative.        Objective:    Physical Exam  There were no vitals taken for this visit. Wt Readings from Last 3 Encounters:  05/28/20 158 lb (71.7 kg)  05/21/20 157 lb (71.2 kg)  07/14/18 180 lb (81.6 kg)    Health Maintenance Due  Topic Date Due  . INFLUENZA VACCINE  Never done  . COVID-19 Vaccine (2 - Pfizer 3-dose series) 06/27/2020    There are no preventive care reminders to display for this patient.   No results found for: TSH Lab Results  Component Value Date   WBC 7.1 05/21/2020   HGB 15.7 05/21/2020   HCT 47.2 05/21/2020   MCV 92 05/21/2020    PLT 339 05/21/2020   Lab Results  Component Value Date   NA 141 05/21/2020   K 4.7 05/21/2020   CO2 23 05/21/2020   GLUCOSE 86 05/21/2020   BUN 16 05/21/2020   CREATININE 0.87 05/21/2020   BILITOT 0.3 05/21/2020   ALKPHOS 90 05/21/2020   AST 24 05/21/2020   ALT 19 05/21/2020   PROT 7.6 05/21/2020   ALBUMIN 5.3 (H) 05/21/2020   CALCIUM 9.8 05/21/2020   ANIONGAP 11 10/17/2016   Lab Results  Component Value Date   CHOL 176 05/21/2020   Lab Results  Component Value Date   HDL 73 05/21/2020   Lab Results  Component Value Date   LDLCALC 93 05/21/2020   Lab Results  Component Value Date   TRIG 52 05/21/2020   No results found for: CHOLHDL No results found for: VVZS8O     Assessment & Plan:   Problem List Items Addressed This Visit      Other   Depression, recurrent (HCC)    -continue effexor       Relevant Medications   venlafaxine XR (EFFEXOR-XR) 37.5 MG 24 hr capsule       Meds ordered this encounter  Medications  . venlafaxine XR (EFFEXOR-XR) 37.5 MG 24 hr capsule    Sig: Take 1 capsule (37.5 mg total) by mouth daily.    Dispense:  90 capsule    Refill:  3    Please fill this Rx. At the time his next fill is due.   Date:  07/16/2020   Location of Patient: Home Location of Provider: Office Consent was obtain for visit to be over via telehealth. I verified that I am speaking with the correct person using two identifiers.  I connected with  Ricardo Stevens on 07/16/20 via telephone and verified that I am speaking with the correct person using two identifiers.   I discussed the limitations of evaluation and management by telemedicine. The patient expressed understanding and agreed to proceed.  Time spent: 9 minutes   Heather Roberts, NP

## 2020-07-28 ENCOUNTER — Ambulatory Visit: Payer: BC Managed Care – PPO

## 2020-08-11 ENCOUNTER — Other Ambulatory Visit: Payer: Self-pay

## 2020-08-11 ENCOUNTER — Encounter: Payer: Self-pay | Admitting: Nurse Practitioner

## 2020-08-11 ENCOUNTER — Telehealth (INDEPENDENT_AMBULATORY_CARE_PROVIDER_SITE_OTHER): Payer: BC Managed Care – PPO | Admitting: Nurse Practitioner

## 2020-08-11 DIAGNOSIS — J019 Acute sinusitis, unspecified: Secondary | ICD-10-CM

## 2020-08-11 DIAGNOSIS — J329 Chronic sinusitis, unspecified: Secondary | ICD-10-CM | POA: Insufficient documentation

## 2020-08-11 MED ORDER — NOREL AD 4-10-325 MG PO TABS: 1.0000 | ORAL_TABLET | ORAL | 1 refills | Status: DC | PRN

## 2020-08-11 MED ORDER — BENZONATATE 100 MG PO CAPS: 100.0000 mg | ORAL_CAPSULE | Freq: Two times a day (BID) | ORAL | 0 refills | Status: DC | PRN

## 2020-08-11 NOTE — Assessment & Plan Note (Signed)
-  Rx. norel -Rx. Tessalon -if no improvement by Thursday will consider abx

## 2020-08-11 NOTE — Progress Notes (Signed)
Acute Office Visit  Subjective:    Patient ID: Ricardo Stevens, male    DOB: [birth year], 25 y.o.   MRN: [id number]  Chief Complaint  Patient presents with  . Cough    X 3 DAYS   . Nasal Congestion    X 3 DAYS     HPI Patient is in today for cough and congestion x3 days.  Symptoms per ROS.  Past Medical History:  Diagnosis Date  . ADHD (attention deficit hyperactivity disorder)   . Asthma    childhood, no recent issues  . Cellulitis and abscess of foot 10/10/2014  . Cellulitis of leg, left 10/09/2014  . Concussion   . Epigastric pain 05/12/2018  . Head injury, acute, without loss of consciousness   . Mononucleosis 07/2017  . Non-intractable vomiting 05/12/2018  . Splenomegaly 08/18/2017    Past Surgical History:  Procedure Laterality Date  . ADENOIDECTOMY    . BIOPSY  07/14/2018   Procedure: BIOPSY;  Surgeon: West Bali, MD;  Location: [facility];  Service: Endoscopy;;  . CIRCUMCISION    . ESOPHAGOGASTRODUODENOSCOPY N/A 07/14/2018   Dr. Darrick Penna: Gastritis/duodenitis.  Small bowel biopsies benign, gastric biopsies with gastritis but no H. pylori.  . TONSILLECTOMY    . TYMPANOSTOMY TUBE PLACEMENT Bilateral   . WISDOM TOOTH EXTRACTION      Family History  Problem Relation Age of Onset  . Hypertension Mother   . Diabetes Maternal Grandmother   . Diabetes Maternal Grandfather   . Diabetes Paternal Grandmother   . Breast cancer Paternal Grandmother   . Diabetes Paternal Grandfather     Social History   Socioeconomic History  . Marital status: Single    Spouse name: Not on file  . Number of children: Not on file  . Years of education: Not on file  . Highest education level: Not on file  Occupational History  . Occupation: Midwife    Comment: [employer]  Tobacco Use  . Smoking status: Former Smoker    Packs/day: 0.50    Years: 2.00    Pack years: 1.00    Types: Cigarettes    Quit date: 03/28/2020    Years since quitting: 0.3  . Smokeless tobacco: Never  Used  Vaping Use  . Vaping Use: Never used  Substance and Sexual Activity  . Alcohol use: Yes    Comment: 4 beers per day  . Drug use: Yes    Types: Marijuana    Comment: last saturday; not a regular occurance   . Sexual activity: Yes    Birth control/protection: Condom  Other Topics Concern  . Not on file  Social History Narrative   Lives at home with Mom, Dad, and Sister. 3 dogs. Mom & Dad smoke outside, not in front of kids.   Social Determinants of Health   Financial Resource Strain: Not on file  Food Insecurity: Not on file  Transportation Needs: Not on file  Physical Activity: Not on file  Stress: Not on file  Social Connections: Not on file  Intimate Partner Violence: Not on file    Outpatient Medications Prior to Visit  Medication Sig Dispense Refill  . fluticasone (FLONASE) 50 MCG/ACT nasal spray Place 2 sprays into both nostrils daily. 16 g 6  . venlafaxine XR (EFFEXOR-XR) 37.5 MG 24 hr capsule Take 1 capsule (37.5 mg total) by mouth daily. 90 capsule 3  . mirtazapine (REMERON) 15 MG tablet Take 1 tablet (15 mg total) by mouth at bedtime. 30 tablet  0   No facility-administered medications prior to visit.    No Known Allergies  Review of Systems  Constitutional: Positive for chills. Negative for fatigue and fever.  HENT: Positive for congestion and sore throat. Negative for ear pain, postnasal drip, rhinorrhea, sinus pressure and sinus pain.   Respiratory: Positive for cough. Negative for shortness of breath and wheezing.   Cardiovascular: Negative.        Objective:    Physical Exam  There were no vitals taken for this visit. Wt Readings from Last 3 Encounters:  05/28/20 158 lb (71.7 kg)  05/21/20 157 lb (71.2 kg)  07/14/18 180 lb (81.6 kg)    Health Maintenance Due  Topic Date Due  . INFLUENZA VACCINE  Never done  . COVID-19 Vaccine (2 - Pfizer 3-dose series) 06/27/2020    There are no preventive care reminders to display for this  patient.   No results found for: TSH Lab Results  Component Value Date   WBC 7.1 05/21/2020   HGB 15.7 05/21/2020   HCT 47.2 05/21/2020   MCV 92 05/21/2020   PLT 339 05/21/2020   Lab Results  Component Value Date   NA 141 05/21/2020   K 4.7 05/21/2020   CO2 23 05/21/2020   GLUCOSE 86 05/21/2020   BUN 16 05/21/2020   CREATININE 0.87 05/21/2020   BILITOT 0.3 05/21/2020   ALKPHOS 90 05/21/2020   AST 24 05/21/2020   ALT 19 05/21/2020   PROT 7.6 05/21/2020   ALBUMIN 5.3 (H) 05/21/2020   CALCIUM 9.8 05/21/2020   ANIONGAP 11 10/17/2016   Lab Results  Component Value Date   CHOL 176 05/21/2020   Lab Results  Component Value Date   HDL 73 05/21/2020   Lab Results  Component Value Date   LDLCALC 93 05/21/2020   Lab Results  Component Value Date   TRIG 52 05/21/2020   No results found for: CHOLHDL No results found for: VQMG8Q     Assessment & Plan:   Problem List Items Addressed This Visit      Respiratory   Sinusitis    -Rx. norel -Rx. Tessalon -if no improvement by Thursday will consider abx      Relevant Medications   benzonatate (TESSALON) 100 MG capsule   Chlorphen-PE-Acetaminophen (NOREL AD) 4-10-325 MG TABS       Meds ordered this encounter  Medications  . benzonatate (TESSALON) 100 MG capsule    Sig: Take 1 capsule (100 mg total) by mouth 2 (two) times daily as needed for cough.    Dispense:  20 capsule    Refill:  0  . Chlorphen-PE-Acetaminophen (NOREL AD) 4-10-325 MG TABS    Sig: Take 1 tablet by mouth every 4 (four) hours as needed (nasal congestion, cold symptoms).    Dispense:  20 tablet    Refill:  1   Date:  08/12/2020   Location of Patient: Home Location of Provider: Office Consent was obtain for visit to be over via telehealth. I verified that I am speaking with the correct person using two identifiers.  I connected with  Rae Sutcliffe Cerniglia on 08/11/20 via telephone and verified that I am speaking with the correct person using two  identifiers.   I discussed the limitations of evaluation and management by telemedicine. The patient expressed understanding and agreed to proceed.  Time spent: 6 min   Heather Roberts, NP

## 2020-08-12 ENCOUNTER — Other Ambulatory Visit: Payer: Self-pay | Admitting: *Deleted

## 2020-08-12 ENCOUNTER — Other Ambulatory Visit: Payer: Self-pay | Admitting: Nurse Practitioner

## 2020-08-12 DIAGNOSIS — R6883 Chills (without fever): Secondary | ICD-10-CM

## 2020-08-12 DIAGNOSIS — J029 Acute pharyngitis, unspecified: Secondary | ICD-10-CM

## 2020-08-12 DIAGNOSIS — R059 Cough, unspecified: Secondary | ICD-10-CM

## 2020-08-12 DIAGNOSIS — Z20822 Contact with and (suspected) exposure to covid-19: Secondary | ICD-10-CM

## 2020-08-12 MED ORDER — PHENYLEPHRINE-APAP-GUAIFENESIN 10-650-400 MG PO TABS: 1.0000 | ORAL_TABLET | Freq: Four times a day (QID) | ORAL | 1 refills | Status: DC | PRN

## 2020-08-13 LAB — COVID-19, FLU A+B NAA
Influenza A, NAA: NOT DETECTED
Influenza B, NAA: NOT DETECTED
SARS-CoV-2, NAA: NOT DETECTED

## 2020-08-15 ENCOUNTER — Other Ambulatory Visit: Payer: Self-pay | Admitting: Nurse Practitioner

## 2020-08-15 ENCOUNTER — Telehealth: Payer: Self-pay

## 2020-08-15 MED ORDER — GUAIFENESIN-DM 100-10 MG/5ML PO SYRP: 15.0000 mL | ORAL_SOLUTION | ORAL | 0 refills | Status: DC | PRN

## 2020-08-15 MED ORDER — AZITHROMYCIN 250 MG PO TABS: ORAL_TABLET | ORAL | 0 refills | Status: DC

## 2020-08-15 NOTE — Telephone Encounter (Signed)
Pt states he is still very congested in his head and coughing up phlegm. No fever. Slight ear pressure.

## 2020-08-15 NOTE — Telephone Encounter (Signed)
Pt informed

## 2020-08-15 NOTE — Telephone Encounter (Signed)
I sent in a z-pack as well as guaifenesin-DM to help with cough and to help get that phlegm up. Drink plenty of water with the guaifenesin to help thin secretions so you can cough them up.

## 2020-08-22 ENCOUNTER — Other Ambulatory Visit: Payer: Self-pay

## 2020-08-22 ENCOUNTER — Emergency Department (HOSPITAL_COMMUNITY)
Admission: EM | Admit: 2020-08-22 | Discharge: 2020-08-22 | Disposition: A | Discharge status: Home / Self Care | Payer: BC Managed Care – PPO | Attending: Emergency Medicine | Admitting: Emergency Medicine

## 2020-08-22 ENCOUNTER — Encounter (HOSPITAL_COMMUNITY): Payer: Self-pay | Admitting: *Deleted

## 2020-08-22 DIAGNOSIS — R112 Nausea with vomiting, unspecified: Secondary | ICD-10-CM | POA: Insufficient documentation

## 2020-08-22 DIAGNOSIS — Z7952 Long term (current) use of systemic steroids: Secondary | ICD-10-CM | POA: Insufficient documentation

## 2020-08-22 DIAGNOSIS — R509 Fever, unspecified: Secondary | ICD-10-CM | POA: Diagnosis not present

## 2020-08-22 DIAGNOSIS — Z20822 Contact with and (suspected) exposure to covid-19: Secondary | ICD-10-CM | POA: Diagnosis not present

## 2020-08-22 DIAGNOSIS — Z87891 Personal history of nicotine dependence: Secondary | ICD-10-CM | POA: Insufficient documentation

## 2020-08-22 DIAGNOSIS — J45909 Unspecified asthma, uncomplicated: Secondary | ICD-10-CM | POA: Insufficient documentation

## 2020-08-22 LAB — BASIC METABOLIC PANEL
Anion gap: 13 (ref 5–15)
BUN: 14 mg/dL (ref 6–20)
CO2: 24 mmol/L (ref 22–32)
Calcium: 9.6 mg/dL (ref 8.9–10.3)
Chloride: 99 mmol/L (ref 98–111)
Creatinine, Ser: 1.21 mg/dL (ref 0.61–1.24)
GFR, Estimated: >60 mL/min (ref >60)
Glucose, Bld: 96 mg/dL (ref 70–99)
Potassium: 3.9 mmol/L (ref 3.5–5.1)
Sodium: 136 mmol/L (ref 135–145)

## 2020-08-22 LAB — CBC WITH DIFFERENTIAL/PLATELET
Abs Immature Granulocytes: 0.09 10*3/uL — ABNORMAL HIGH (ref 0.00–0.07)
Basophils Absolute: 0.1 10*3/uL (ref 0.0–0.1)
Basophils Relative: 0 %
Eosinophils Absolute: 0 10*3/uL (ref 0.0–0.5)
Eosinophils Relative: 0 %
HCT: 45.3 % (ref 39.0–52.0)
Hemoglobin: 15.4 g/dL (ref 13.0–17.0)
Immature Granulocytes: 1 %
Lymphocytes Relative: 9 %
Lymphs Abs: 1.5 10*3/uL (ref 0.7–4.0)
MCH: 29.9 pg (ref 26.0–34.0)
MCHC: 34 g/dL (ref 30.0–36.0)
MCV: 88 fL (ref 80.0–100.0)
Monocytes Absolute: 1.2 10*3/uL — ABNORMAL HIGH (ref 0.1–1.0)
Monocytes Relative: 7 %
Neutro Abs: 14.3 10*3/uL — ABNORMAL HIGH (ref 1.7–7.7)
Neutrophils Relative %: 83 %
Platelets: 345 10*3/uL (ref 150–400)
RBC: 5.15 MIL/uL (ref 4.22–5.81)
RDW: 11.6 % (ref 11.5–15.5)
WBC: 17.2 10*3/uL — ABNORMAL HIGH (ref 4.0–10.5)
nRBC: 0 % (ref 0.0–0.2)

## 2020-08-22 LAB — SARS CORONAVIRUS 2 (TAT 6-24 HRS): SARS Coronavirus 2: NEGATIVE

## 2020-08-22 MED ORDER — ACETAMINOPHEN 325 MG PO TABS
650.0000 mg | ORAL_TABLET | Freq: Once | ORAL | Status: AC
  Administered 2020-08-22: 650 mg via ORAL
  Filled 2020-08-22: qty 2

## 2020-08-22 MED ORDER — ONDANSETRON 4 MG PO TBDP: 4.0000 mg | ORAL_TABLET | Freq: Three times a day (TID) | ORAL | 0 refills | Status: DC | PRN

## 2020-08-22 MED ORDER — ONDANSETRON HCL 4 MG/2ML IJ SOLN
4.0000 mg | Freq: Once | INTRAMUSCULAR | Status: AC
  Administered 2020-08-22: 4 mg via INTRAVENOUS
  Filled 2020-08-22: qty 2

## 2020-08-22 MED ORDER — SODIUM CHLORIDE 0.9 % IV BOLUS
500.0000 mL | Freq: Once | INTRAVENOUS | Status: AC
  Administered 2020-08-22: 500 mL via INTRAVENOUS

## 2020-08-22 NOTE — ED Triage Notes (Signed)
Patient c/o headache onset yes, states he had a temp of 103 at home last pm, c/o generalized boyd aches. States he took an in home covid test of which was negative.

## 2020-08-22 NOTE — ED Provider Notes (Signed)
[facility] EMERGENCY DEPARTMENT Provider Note   CSN: [id number] Arrival date & time: 08/22/20  0555     History Chief Complaint  Patient presents with  . Generalized Body Aches    Ricardo Stevens is a 25 y.o. male.  HPI Patient is a 25 year old male with past medical history significant for ADHD, asthma, epigastric pain, splenomegaly, nausea vomiting, gastritis, alcohol use  Patient is presented today for nausea and vomiting that began yesterday.  He states that he has vomited constantly since yesterday evening.  He denies any abdominal pain apart from when he is actively vomiting.  Denies any chest pain, shortness of breath, fevers, chills, diarrhea.  He does endorse a mild headache which she states is not presently bothering him.  He says he has had a fever as high as 103.  He has been taking ibuprofen which he states he has been able to keep down.  His mother states that he has vomited 40 times.  Patient denies any other associate symptoms.  No aggravating mitigating factors are he denies any cough but does state that he has had some congestion.  He states he is vaccinated for COVID-19 states that he has had Covid twice within the past 2 years.  Denies any urinary symptoms such as frequency, urgency, dysuria.  Denies any back or neck pain.    Past Medical History:  Diagnosis Date  . ADHD (attention deficit hyperactivity disorder)   . Asthma    childhood, no recent issues  . Cellulitis and abscess of foot 10/10/2014  . Cellulitis of leg, left 10/09/2014  . Concussion   . Epigastric pain 05/12/2018  . Head injury, acute, without loss of consciousness   . Mononucleosis 07/2017  . Non-intractable vomiting 05/12/2018  . Splenomegaly 08/18/2017    Patient Active Problem List   Diagnosis Date Noted  . Sinusitis 08/11/2020  . Otalgia of right ear 06/09/2020  . Depression, recurrent (HCC) 06/09/2020  . Severe episode of recurrent major depressive disorder,  without psychotic features [facility])     Past Surgical History:  Procedure Laterality Date  . ADENOIDECTOMY    . BIOPSY  07/14/2018   Procedure: BIOPSY;  Surgeon: West Bali, MD;  Location: [facility];  Service: Endoscopy;;  . CIRCUMCISION    . ESOPHAGOGASTRODUODENOSCOPY N/A 07/14/2018   Dr. Darrick Penna: Gastritis/duodenitis.  Small bowel biopsies benign, gastric biopsies with gastritis but no H. pylori.  . TONSILLECTOMY    . TYMPANOSTOMY TUBE PLACEMENT Bilateral   . WISDOM TOOTH EXTRACTION         Family History  Problem Relation Age of Onset  . Hypertension Mother   . Diabetes Maternal Grandmother   . Diabetes Maternal Grandfather   . Diabetes Paternal Grandmother   . Breast cancer Paternal Grandmother   . Diabetes Paternal Grandfather     Social History   Tobacco Use  . Smoking status: Former Smoker    Packs/day: 0.50    Years: 2.00    Pack years: 1.00    Types: Cigarettes    Quit date: 03/28/2020    Years since quitting: 0.4  . Smokeless tobacco: Never Used  Vaping Use  . Vaping Use: Never used  Substance Use Topics  . Alcohol use: Yes    Comment: 4 beers per day  . Drug use: Yes    Types: Marijuana    Comment: last saturday; not a regular occurance     Home Medications Prior to Admission medications   Medication Sig  Start Date End Date Taking? Authorizing Provider  ondansetron (ZOFRAN ODT) 4 MG disintegrating tablet Take 1 tablet (4 mg total) by mouth every 8 (eight) hours as needed for nausea or vomiting. 08/22/20  Yes Gailen Shelter, PA  azithromycin (ZITHROMAX) 250 MG tablet Take as directed 08/15/20   Heather Roberts, NP  benzonatate (TESSALON) 100 MG capsule Take 1 capsule (100 mg total) by mouth 2 (two) times daily as needed for cough. 08/11/20   Heather Roberts, NP  fluticasone Aleda Grana) 50 MCG/ACT nasal spray Place 2 sprays into both nostrils daily. 06/09/20   Heather Roberts, NP  guaiFENesin-dextromethorphan (ROBITUSSIN DM) 100-10 MG/5ML syrup Take 15 mLs  by mouth every 4 (four) hours as needed for cough. 08/15/20   Heather Roberts, NP  mirtazapine (REMERON) 15 MG tablet Take 1 tablet (15 mg total) by mouth at bedtime. 05/06/20 06/05/20  Money, Gerlene Burdock, FNP  Phenylephrine-APAP-guaiFENesin 10-650-400 MG TABS Take 1 tablet by mouth every 6 (six) hours as needed. 08/12/20   Heather Roberts, NP  venlafaxine XR (EFFEXOR-XR) 37.5 MG 24 hr capsule Take 1 capsule (37.5 mg total) by mouth daily. 07/16/20   Heather Roberts, NP    Allergies    Patient has no known allergies.  Review of Systems   Review of Systems  Constitutional: Negative for chills and fever.  HENT: Negative for congestion.   Eyes: Negative for pain.  Respiratory: Negative for cough and shortness of breath.   Cardiovascular: Negative for chest pain and leg swelling.  Gastrointestinal: Positive for nausea and vomiting. Negative for abdominal pain.  Genitourinary: Negative for dysuria.  Musculoskeletal: Negative for myalgias.  Skin: Negative for rash.  Neurological: Negative for dizziness and headaches.    Physical Exam Updated Vital Signs BP 130/80   Pulse 81   Temp 99.3 F (37.4 C) (Oral)   Resp 14   Ht 5\' 6"  (1.676 m)   Wt 70.8 kg   SpO2 98%   BMI 25.18 kg/m   Physical Exam Vitals and nursing note reviewed.  Constitutional:      General: He is not in acute distress. HENT:     Head: Normocephalic and atraumatic.     Nose: Nose normal.  Eyes:     General: No scleral icterus. Cardiovascular:     Rate and Rhythm: Normal rate and regular rhythm.     Pulses: Normal pulses.     Heart sounds: Normal heart sounds.  Pulmonary:     Effort: Pulmonary effort is normal. No respiratory distress.     Breath sounds: No wheezing.  Abdominal:     Palpations: Abdomen is soft.     Tenderness: There is no abdominal tenderness.     Comments: Abdomen soft, nontender, no guarding or rebound.  No CVA tenderness  Musculoskeletal:     Cervical back: Normal range of motion.     Right  lower leg: No edema.     Left lower leg: No edema.  Skin:    General: Skin is warm and dry.     Capillary Refill: Capillary refill takes less than 2 seconds.  Neurological:     Mental Status: He is alert. Mental status is at baseline.  Psychiatric:        Mood and Affect: Mood normal.        Behavior: Behavior normal.     ED Results / Procedures / Treatments   Labs (all labs ordered are listed, but only abnormal results are displayed) Labs Reviewed  CBC WITH DIFFERENTIAL/PLATELET - Abnormal; Notable for the following components:      Result Value   WBC 17.2 (*)    Neutro Abs 14.3 (*)    Monocytes Absolute 1.2 (*)    Abs Immature Granulocytes 0.09 (*)    All other components within normal limits  SARS CORONAVIRUS 2 (TAT 6-24 HRS)  BASIC METABOLIC PANEL    EKG None  Radiology No results found.  Procedures Procedures   Medications Ordered in ED Medications  ondansetron (ZOFRAN) injection 4 mg (4 mg Intravenous Given 08/22/20 0724)  acetaminophen (TYLENOL) tablet 650 mg (650 mg Oral Given 08/22/20 0724)  sodium chloride 0.9 % bolus 500 mL (0 mLs Intravenous Stopped 08/22/20 7062)    ED Course  I have reviewed the triage vital signs and the nursing notes.  Pertinent labs & imaging results that were available during my care of the patient were reviewed by me and considered in my medical decision making (see chart for details).    MDM Rules/Calculators/A&P                          Patient is 25 year old male with past medical history detailed in HPI presented today for nausea vomiting.  He is otherwise without any other significant associated complaints.  He has been feeling fatigued however.  He does have lab work with leukocytosis likely to marginalization from excessive vomiting.  BMP without abnormalities.  Covid test was negative.  Patient responded immediately to Zofran.  He states he feels significantly improved.  I also provide patient with 500 mils of normal  saline.  He also tolerated p.o. and drink several cups of water in the ER.  No further episodes of emesis.  I provided patient with return precautions.  He has no headache I doubt intracranial intra-abdominal pathology causing his nausea or vomiting today.  Return precautions given he is understanding of plan.  Final Clinical Impression(s) / ED Diagnoses Final diagnoses:  Non-intractable vomiting with nausea, unspecified vomiting type    Rx / DC Orders ED Discharge Orders         Ordered    ondansetron (ZOFRAN ODT) 4 MG disintegrating tablet  Every 8 hours PRN        08/22/20 0813           Gailen Shelter, PA 08/22/20 1709    Sabino Donovan, MD 08/25/20 [phone]

## 2020-08-22 NOTE — Discharge Instructions (Addendum)
Please drink plenty of water, please use nausea medicine as prescribed.  I recommend going ahead and taking this medication every 8 hours for 2 doses.  You Kappes take the first dose as soon as noon. Recommend use bland food such as bread, bananas, applesauce, toast, water and Pedialyte for hydration.  You Urbanczyk always return to the ER for any new or concerning symptoms.  Please follow-up with your primary care doctor.

## 2020-09-11 ENCOUNTER — Other Ambulatory Visit: Payer: Self-pay | Admitting: *Deleted

## 2020-09-11 DIAGNOSIS — Z20822 Contact with and (suspected) exposure to covid-19: Secondary | ICD-10-CM

## 2020-09-13 LAB — NOVEL CORONAVIRUS, NAA: SARS-CoV-2, NAA: NOT DETECTED

## 2020-09-13 LAB — SARS-COV-2, NAA 2 DAY TAT

## 2021-01-11 ENCOUNTER — Emergency Department (HOSPITAL_BASED_OUTPATIENT_CLINIC_OR_DEPARTMENT_OTHER): Payer: BC Managed Care – PPO

## 2021-01-11 ENCOUNTER — Emergency Department (HOSPITAL_BASED_OUTPATIENT_CLINIC_OR_DEPARTMENT_OTHER)
Admission: EM | Admit: 2021-01-11 | Discharge: 2021-01-11 | Disposition: A | Discharge status: Home / Self Care | Payer: BC Managed Care – PPO | Attending: Emergency Medicine | Admitting: Emergency Medicine

## 2021-01-11 ENCOUNTER — Other Ambulatory Visit: Payer: Self-pay

## 2021-01-11 ENCOUNTER — Encounter (HOSPITAL_BASED_OUTPATIENT_CLINIC_OR_DEPARTMENT_OTHER): Payer: Self-pay

## 2021-01-11 DIAGNOSIS — S62357A Nondisplaced fracture of shaft of fifth metacarpal bone, left hand, initial encounter for closed fracture: Secondary | ICD-10-CM | POA: Insufficient documentation

## 2021-01-11 DIAGNOSIS — W231XXA Caught, crushed, jammed, or pinched between stationary objects, initial encounter: Secondary | ICD-10-CM | POA: Diagnosis not present

## 2021-01-11 DIAGNOSIS — Y92810 Car as the place of occurrence of the external cause: Secondary | ICD-10-CM | POA: Insufficient documentation

## 2021-01-11 DIAGNOSIS — S61412A Laceration without foreign body of left hand, initial encounter: Secondary | ICD-10-CM | POA: Diagnosis not present

## 2021-01-11 DIAGNOSIS — S62355A Nondisplaced fracture of shaft of fourth metacarpal bone, left hand, initial encounter for closed fracture: Secondary | ICD-10-CM

## 2021-01-11 DIAGNOSIS — J45909 Unspecified asthma, uncomplicated: Secondary | ICD-10-CM | POA: Insufficient documentation

## 2021-01-11 DIAGNOSIS — Z23 Encounter for immunization: Secondary | ICD-10-CM | POA: Insufficient documentation

## 2021-01-11 DIAGNOSIS — S6992XA Unspecified injury of left wrist, hand and finger(s), initial encounter: Secondary | ICD-10-CM | POA: Diagnosis present

## 2021-01-11 DIAGNOSIS — Z87891 Personal history of nicotine dependence: Secondary | ICD-10-CM | POA: Insufficient documentation

## 2021-01-11 MED ORDER — AMOXICILLIN-POT CLAVULANATE 875-125 MG PO TABS: 1.0000 | ORAL_TABLET | Freq: Two times a day (BID) | ORAL | 0 refills | Status: DC

## 2021-01-11 MED ORDER — CEFAZOLIN SODIUM-DEXTROSE 2-4 GM/100ML-% IV SOLN
2.0000 g | INTRAVENOUS | Status: AC
  Administered 2021-01-11: 2 g via INTRAVENOUS
  Filled 2021-01-11: qty 100

## 2021-01-11 MED ORDER — OXYCODONE-ACETAMINOPHEN 5-325 MG PO TABS
1.0000 | ORAL_TABLET | Freq: Once | ORAL | Status: AC
  Administered 2021-01-11: 1 via ORAL
  Filled 2021-01-11: qty 1

## 2021-01-11 MED ORDER — TETANUS-DIPHTH-ACELL PERTUSSIS 5-2.5-18.5 LF-MCG/0.5 IM SUSY
0.5000 mL | PREFILLED_SYRINGE | Freq: Once | INTRAMUSCULAR | Status: AC
  Administered 2021-01-11: 0.5 mL via INTRAMUSCULAR
  Filled 2021-01-11: qty 0.5

## 2021-01-11 NOTE — Discharge Instructions (Addendum)
You were seen in the emergency department for injury to your left hand.  You fractured the fourth and fifth metacarpal and you also had a hand laceration.  We are putting you on some antibiotics.  Splint.  Please keep the splint on clean and dry and call [facility] tomorrow to be seen by Dr. Amanda Pea or Orlan Leavens.  Tylenol and ibuprofen for pain.

## 2021-01-11 NOTE — ED Triage Notes (Signed)
He states his left hand was "caught by a hitch as I was loading our sea-doos". He has a lac. With minimal bleeding at left ant. 4th metacarpal area with swelling of the entirety of his palmar area. He mentions having seen Dr. Amanda Pea in the past for a problem with his right hand.

## 2021-01-11 NOTE — ED Notes (Signed)
Left hand wound cleaned.  Wound to palm appears facial abrasion with skin flap.  Bleeding controlled

## 2021-01-11 NOTE — ED Provider Notes (Signed)
[facility] EMERGENCY DEPT Provider Note   CSN: [id number] Arrival date & time: 01/11/21  1249     History Chief Complaint  Patient presents with   Hand Injury    Ricardo Stevens is a 25 y.o. male.  He is right-hand dominant.  Complaining of a crush injury to his left hand.  He was trying to keep a trailer hitch from hitting his car and his hand got in between.  Complaining of bleeding laceration and swelling.  Occurred about 1 hour ago.  Unknown last tetanus shot.  Has seen Dr. Amanda Pea in the past for 5th metacarpal fracture.  No numbness.  No other injuries or complaints  The history is provided by the patient.  Hand Injury Location:  Hand Hand location:  L hand Injury: yes   Time since incident:  1 hour Mechanism of injury: crush   Crush injury:    Duration of crushing force:  2 seconds Pain details:    Quality:  Throbbing   Radiates to:  Does not radiate   Severity:  Moderate   Onset quality:  Sudden   Duration:  1 hour   Timing:  Constant   Progression:  Unchanged Handedness:  Right-handed Dislocation: no   Foreign body present:  No foreign bodies Tetanus status:  Out of date Prior injury to area:  No Relieved by:  None tried Worsened by:  Movement Ineffective treatments:  Ice Associated symptoms: no fever and no numbness       Past Medical History:  Diagnosis Date   ADHD (attention deficit hyperactivity disorder)    Asthma    childhood, no recent issues   Cellulitis and abscess of foot 10/10/2014   Cellulitis of leg, left 10/09/2014   Concussion    Epigastric pain 05/12/2018   Head injury, acute, without loss of consciousness    Mononucleosis 07/2017   Non-intractable vomiting 05/12/2018   Splenomegaly 08/18/2017    Patient Active Problem List   Diagnosis Date Noted   Sinusitis 08/11/2020   Otalgia of right ear 06/09/2020   Depression, recurrent (HCC) 06/09/2020   Severe episode of recurrent major depressive disorder, without psychotic  features [facility])     Past Surgical History:  Procedure Laterality Date   ADENOIDECTOMY     BIOPSY  07/14/2018   Procedure: BIOPSY;  Surgeon: West Bali, MD;  Location: [facility];  Service: Endoscopy;;   CIRCUMCISION     ESOPHAGOGASTRODUODENOSCOPY N/A 07/14/2018   Dr. Darrick Penna: Gastritis/duodenitis.  Small bowel biopsies benign, gastric biopsies with gastritis but no H. pylori.   TONSILLECTOMY     TYMPANOSTOMY TUBE PLACEMENT Bilateral    WISDOM TOOTH EXTRACTION         Family History  Problem Relation Age of Onset   Hypertension Mother    Diabetes Maternal Grandmother    Diabetes Maternal Grandfather    Diabetes Paternal Grandmother    Breast cancer Paternal Grandmother    Diabetes Paternal Grandfather     Social History   Tobacco Use   Smoking status: Former    Packs/day: 0.50    Years: 2.00    Pack years: 1.00    Types: Cigarettes    Quit date: 03/28/2020    Years since quitting: 0.7   Smokeless tobacco: Never  Vaping Use   Vaping Use: Never used  Substance Use Topics   Alcohol use: Yes    Comment: 4 beers per day   Drug use: Yes    Types: Marijuana  Comment: last saturday; not a regular occurance     Home Medications Prior to Admission medications   Medication Sig Start Date End Date Taking? Authorizing Provider  azithromycin (ZITHROMAX) 250 MG tablet Take as directed 08/15/20   Heather Roberts, NP  benzonatate (TESSALON) 100 MG capsule Take 1 capsule (100 mg total) by mouth 2 (two) times daily as needed for cough. 08/11/20   Heather Roberts, NP  fluticasone Aleda Grana) 50 MCG/ACT nasal spray Place 2 sprays into both nostrils daily. 06/09/20   Heather Roberts, NP  guaiFENesin-dextromethorphan (ROBITUSSIN DM) 100-10 MG/5ML syrup Take 15 mLs by mouth every 4 (four) hours as needed for cough. 08/15/20   Heather Roberts, NP  mirtazapine (REMERON) 15 MG tablet Take 1 tablet (15 mg total) by mouth at bedtime. 05/06/20 06/05/20  Money, Gerlene Burdock, FNP  ondansetron (ZOFRAN  ODT) 4 MG disintegrating tablet Take 1 tablet (4 mg total) by mouth every 8 (eight) hours as needed for nausea or vomiting. 08/22/20   Gailen Shelter, PA  Phenylephrine-APAP-guaiFENesin 10-650-400 MG TABS Take 1 tablet by mouth every 6 (six) hours as needed. 08/12/20   Heather Roberts, NP  venlafaxine XR (EFFEXOR-XR) 37.5 MG 24 hr capsule Take 1 capsule (37.5 mg total) by mouth daily. 07/16/20   Heather Roberts, NP    Allergies    Patient has no known allergies.  Review of Systems   Review of Systems  Constitutional:  Negative for fever.  Skin:  Positive for wound.   Physical Exam Updated Vital Signs BP (!) 154/97 (BP Location: Right Arm)   Pulse 69   Temp 98.4 F (36.9 C)   Resp 14   SpO2 100%   Physical Exam Vitals and nursing note reviewed.  Constitutional:      Appearance: He is well-developed.  HENT:     Head: Normocephalic and atraumatic.  Eyes:     Conjunctiva/sclera: Conjunctivae normal.  Pulmonary:     Effort: Pulmonary effort is normal.  Musculoskeletal:     Cervical back: Neck supple.     Comments: He has a small avulsion laceration in his mid palm.  Minimal active bleeding there is some diffuse tenderness through his medial mid hand.  Digits unaffected.  Radial pulse 2+  Skin:    General: Skin is warm and dry.  Neurological:     Mental Status: He is alert.     GCS: GCS eye subscore is 4. GCS verbal subscore is 5. GCS motor subscore is 6.     ED Results / Procedures / Treatments   Labs (all labs ordered are listed, but only abnormal results are displayed) Labs Reviewed - No data to display  EKG None  Radiology DG Hand Complete Left  Result Date: 01/11/2021 CLINICAL DATA:  Crush injury. EXAM: LEFT HAND - COMPLETE 3+ VIEW COMPARISON:  None. FINDINGS: Midshaft fractures of the fourth and fifth metacarpal bones. Minimal displacement. Soft tissue swelling associated with the fractures. IMPRESSION: Midshaft fractures of the fourth and fifth metacarpals.  Electronically Signed   By: Genevive Bi M.D.   On: 01/11/2021 13:35    Procedures Procedures   Medications Ordered in ED Medications  oxyCODONE-acetaminophen (PERCOCET/ROXICET) 5-325 MG per tablet 1 tablet (1 tablet Oral Given 01/11/21 1348)  ceFAZolin (ANCEF) IVPB 2g/100 mL premix (2 g Intravenous New Bag/Given 01/11/21 1432)  Tdap (BOOSTRIX) injection 0.5 mL (0.5 mLs Intramuscular Given 01/11/21 1432)    ED Course  I have reviewed the triage vital signs and the  nursing notes.  Pertinent labs & imaging results that were available during my care of the patient were reviewed by me and considered in my medical decision making (see chart for details).  Clinical Course as of 01/11/21 1655  Sun Jan 11, 2021  1342 Discussed with Dr. Charlann Boxer from [facility]  He is recommending wound care Ancef Augmentin for discharge ulnar gutter and follow-up tomorrow in the office while either Gramig or Ortman [MB]  1504 Ulnar gutter applied by ED tech.  Normal CSM's after application. [MB]    Clinical Course User Index [MB] Terrilee Files, MD   MDM Rules/Calculators/A&P                         Differential diagnosis includes fracture, contusion, open fracture, laceration, dislocation.  X-rays ordered and interpreted by me as acute fracture of fourth and fifth metacarpal shafts.  Nondisplaced.  He does have a wound in that area although I think it is unlikely that this represents an open fracture.  His wound was cleansed and he was given IV antibiotics.  Per Ortho recommendations placing an ulnar gutter and having close follow-up, on oral antibiotics.  Final Clinical Impression(s) / ED Diagnoses Final diagnoses:  Closed nondisplaced fracture of shaft of fifth metacarpal bone of left hand, initial encounter  Closed nondisplaced fracture of shaft of fourth metacarpal bone of left hand, initial encounter  Laceration of left hand without foreign body, initial encounter    Rx / DC Orders ED Discharge  Orders          Ordered    amoxicillin-clavulanate (AUGMENTIN) 875-125 MG tablet  Every 12 hours        01/11/21 1438             Terrilee Files, MD 01/11/21 1657

## 2021-01-12 ENCOUNTER — Other Ambulatory Visit: Payer: Self-pay | Admitting: Nurse Practitioner

## 2021-01-12 ENCOUNTER — Telehealth: Payer: Self-pay

## 2021-01-12 MED ORDER — TRAMADOL HCL 50 MG PO TABS: 50.0000 mg | ORAL_TABLET | Freq: Three times a day (TID) | ORAL | 0 refills | Status: AC | PRN

## 2021-01-12 NOTE — Telephone Encounter (Signed)
I sent in tramadol. He can take 800 mg of ibuprofen TID. I can send it in, or he can get it OTC if he already has ibuprofen at home.

## 2021-01-12 NOTE — Telephone Encounter (Signed)
Pt mother informed

## 2021-01-12 NOTE — Telephone Encounter (Signed)
Patient mother called was seen in ER yesterday for fractured hand, no pain meds were given.  Has an appointment scheduled Wednesday with Orthopedics.  Can pain medicine be called into the pharmacy just enough until patient sees Orthopedic on Wednesday.    Pharmacy  [phone] [place], [place] - [address]  [address] [place], [place] [address]

## 2021-01-21 ENCOUNTER — Encounter (HOSPITAL_COMMUNITY): Payer: Self-pay | Admitting: Orthopedic Surgery

## 2021-01-21 NOTE — Progress Notes (Signed)
DUE TO COVID-19 ONLY ONE VISITOR IS ALLOWED TO COME WITH YOU AND STAY IN THE WAITING ROOM ONLY DURING PRE OP AND PROCEDURE DAY OF SURGERY.   PCP - Dr Bjorn Pippin Cardiologist - n/a  Chest x-ray - n/a EKG - n/a Stress Test - n/a ECHO - n/a Cardiac Cath - n/a  Sleep Study -  n/a CPAP - none  ERAS: Clear liquids til 2 pm on DOS  STOP now taking any Aspirin (unless otherwise instructed by your surgeon), Aleve, Naproxen, Ibuprofen, Motrin, Advil, Goody's, BC's, all herbal medications, fish oil, and all vitamins.   Coronavirus Screening Covid test n/a - Ambulatory Surgery Do you have any of the following symptoms:  Cough yes/no: No Fever (>100.32F)  yes/no: No Runny nose yes/no: No Sore throat yes/no: No Difficulty breathing/shortness of breath  yes/no: No  Have you traveled in the last 14 days and where? yes/no: No  Patient verbalized understanding of instructions that were given via phone.

## 2021-01-22 ENCOUNTER — Ambulatory Visit (HOSPITAL_COMMUNITY): Payer: BC Managed Care – PPO | Admitting: Anesthesiology

## 2021-01-22 ENCOUNTER — Encounter (HOSPITAL_COMMUNITY): Payer: Self-pay | Admitting: Orthopedic Surgery

## 2021-01-22 ENCOUNTER — Ambulatory Visit (HOSPITAL_COMMUNITY)
Admission: RE | Admit: 2021-01-22 | Discharge: 2021-01-22 | Disposition: A | Discharge status: Home / Self Care | Payer: BC Managed Care – PPO | Attending: Orthopedic Surgery | Admitting: Orthopedic Surgery

## 2021-01-22 ENCOUNTER — Encounter (HOSPITAL_COMMUNITY)
Admission: RE | Disposition: A | Discharge status: Home / Self Care | Payer: Self-pay | Source: Home / Self Care | Attending: Orthopedic Surgery

## 2021-01-22 DIAGNOSIS — Z79899 Other long term (current) drug therapy: Secondary | ICD-10-CM | POA: Insufficient documentation

## 2021-01-22 DIAGNOSIS — X58XXXA Exposure to other specified factors, initial encounter: Secondary | ICD-10-CM | POA: Insufficient documentation

## 2021-01-22 DIAGNOSIS — Z87891 Personal history of nicotine dependence: Secondary | ICD-10-CM | POA: Insufficient documentation

## 2021-01-22 DIAGNOSIS — S62305A Unspecified fracture of fourth metacarpal bone, left hand, initial encounter for closed fracture: Secondary | ICD-10-CM | POA: Insufficient documentation

## 2021-01-22 HISTORY — DX: Anxiety disorder, unspecified: F41.9

## 2021-01-22 HISTORY — PX: OPEN REDUCTION INTERNAL FIXATION (ORIF) METACARPAL: SHX6234

## 2021-01-22 HISTORY — DX: Depression, unspecified: F32.A

## 2021-01-22 SURGERY — OPEN REDUCTION INTERNAL FIXATION (ORIF) METACARPAL
Anesthesia: General | Site: Finger | Laterality: Left

## 2021-01-22 MED ORDER — HYDROMORPHONE HCL 1 MG/ML IJ SOLN
0.2500 mg | INTRAMUSCULAR | Status: DC | PRN
  Administered 2021-01-22: 0.25 mg via INTRAVENOUS

## 2021-01-22 MED ORDER — PHENYLEPHRINE 40 MCG/ML (10ML) SYRINGE FOR IV PUSH (FOR BLOOD PRESSURE SUPPORT)
PREFILLED_SYRINGE | INTRAVENOUS | Status: DC | PRN
  Administered 2021-01-22: 80 ug via INTRAVENOUS

## 2021-01-22 MED ORDER — CEFAZOLIN SODIUM-DEXTROSE 2-3 GM-%(50ML) IV SOLR
INTRAVENOUS | Status: DC | PRN
  Administered 2021-01-22: 2 g via INTRAVENOUS

## 2021-01-22 MED ORDER — 0.9 % SODIUM CHLORIDE (POUR BTL) OPTIME
TOPICAL | Status: DC | PRN
  Administered 2021-01-22: 1000 mL

## 2021-01-22 MED ORDER — MIDAZOLAM HCL 2 MG/2ML IJ SOLN: 0.5000 mg | Freq: Once | INTRAMUSCULAR | Status: DC | PRN

## 2021-01-22 MED ORDER — ONDANSETRON HCL 4 MG/2ML IJ SOLN
INTRAMUSCULAR | Status: DC | PRN
  Administered 2021-01-22: 4 mg via INTRAVENOUS

## 2021-01-22 MED ORDER — PROMETHAZINE HCL 25 MG/ML IJ SOLN: 6.2500 mg | INTRAMUSCULAR | Status: DC | PRN

## 2021-01-22 MED ORDER — DEXAMETHASONE SODIUM PHOSPHATE 10 MG/ML IJ SOLN
INTRAMUSCULAR | Status: AC
  Filled 2021-01-22: qty 1

## 2021-01-22 MED ORDER — CHLORHEXIDINE GLUCONATE 0.12 % MT SOLN
15.0000 mL | OROMUCOSAL | Status: AC
  Administered 2021-01-22: 15 mL via OROMUCOSAL
  Filled 2021-01-22 (×2): qty 15

## 2021-01-22 MED ORDER — MIDAZOLAM HCL 2 MG/2ML IJ SOLN
INTRAMUSCULAR | Status: AC
  Filled 2021-01-22: qty 2

## 2021-01-22 MED ORDER — HYDROMORPHONE HCL 1 MG/ML IJ SOLN
INTRAMUSCULAR | Status: AC
  Filled 2021-01-22: qty 1

## 2021-01-22 MED ORDER — ACETAMINOPHEN 500 MG PO TABS
1000.0000 mg | ORAL_TABLET | Freq: Once | ORAL | Status: AC
  Administered 2021-01-22: 1000 mg via ORAL

## 2021-01-22 MED ORDER — CEFAZOLIN SODIUM-DEXTROSE 2-4 GM/100ML-% IV SOLN
INTRAVENOUS | Status: AC
  Filled 2021-01-22: qty 100

## 2021-01-22 MED ORDER — MIDAZOLAM HCL 2 MG/2ML IJ SOLN
INTRAMUSCULAR | Status: DC | PRN
  Administered 2021-01-22: 2 mg via INTRAVENOUS

## 2021-01-22 MED ORDER — LIDOCAINE 2% (20 MG/ML) 5 ML SYRINGE
INTRAMUSCULAR | Status: DC | PRN
  Administered 2021-01-22: 20 mg via INTRAVENOUS

## 2021-01-22 MED ORDER — DEXAMETHASONE SODIUM PHOSPHATE 10 MG/ML IJ SOLN
INTRAMUSCULAR | Status: DC | PRN
  Administered 2021-01-22: 5 mg via INTRAVENOUS

## 2021-01-22 MED ORDER — BUPIVACAINE HCL (PF) 0.25 % IJ SOLN
INTRAMUSCULAR | Status: AC
  Filled 2021-01-22: qty 30

## 2021-01-22 MED ORDER — MEPERIDINE HCL 25 MG/ML IJ SOLN: 6.2500 mg | INTRAMUSCULAR | Status: DC | PRN

## 2021-01-22 MED ORDER — ONDANSETRON HCL 4 MG/2ML IJ SOLN
INTRAMUSCULAR | Status: AC
  Filled 2021-01-22: qty 2

## 2021-01-22 MED ORDER — OXYCODONE HCL 5 MG/5ML PO SOLN: 5.0000 mg | Freq: Once | ORAL | Status: DC | PRN

## 2021-01-22 MED ORDER — PROPOFOL 10 MG/ML IV BOLUS
INTRAVENOUS | Status: AC
  Filled 2021-01-22: qty 40

## 2021-01-22 MED ORDER — PROPOFOL 10 MG/ML IV BOLUS
INTRAVENOUS | Status: DC | PRN
  Administered 2021-01-22: 200 mg via INTRAVENOUS

## 2021-01-22 MED ORDER — LACTATED RINGERS IV SOLN: INTRAVENOUS | Status: DC

## 2021-01-22 MED ORDER — DEXMEDETOMIDINE (PRECEDEX) IN NS 20 MCG/5ML (4 MCG/ML) IV SYRINGE
PREFILLED_SYRINGE | INTRAVENOUS | Status: DC | PRN
  Administered 2021-01-22: 8 ug via INTRAVENOUS

## 2021-01-22 MED ORDER — FENTANYL CITRATE (PF) 250 MCG/5ML IJ SOLN
INTRAMUSCULAR | Status: AC
  Filled 2021-01-22: qty 5

## 2021-01-22 MED ORDER — FENTANYL CITRATE (PF) 100 MCG/2ML IJ SOLN
INTRAMUSCULAR | Status: AC
  Filled 2021-01-22: qty 2

## 2021-01-22 MED ORDER — ACETAMINOPHEN 500 MG PO TABS
ORAL_TABLET | ORAL | Status: AC
  Filled 2021-01-22: qty 2

## 2021-01-22 MED ORDER — FENTANYL CITRATE (PF) 250 MCG/5ML IJ SOLN
INTRAMUSCULAR | Status: DC | PRN
  Administered 2021-01-22: 100 ug via INTRAVENOUS
  Administered 2021-01-22: 50 ug via INTRAVENOUS

## 2021-01-22 MED ORDER — OXYCODONE HCL 5 MG PO TABS: 5.0000 mg | ORAL_TABLET | Freq: Once | ORAL | Status: DC | PRN

## 2021-01-22 SURGICAL SUPPLY — 55 items
BAG COUNTER SPONGE SURGICOUNT (BAG) ×2 IMPLANT
BAG SPNG CNTER NS LX DISP (BAG) ×1
BLADE CLIPPER SURG (BLADE) IMPLANT
BNDG CMPR 9X4 STRL LF SNTH (GAUZE/BANDAGES/DRESSINGS) ×1
BNDG ELASTIC 3X5.8 VLCR STR LF (GAUZE/BANDAGES/DRESSINGS) IMPLANT
BNDG ELASTIC 4X5.8 VLCR STR LF (GAUZE/BANDAGES/DRESSINGS) ×4 IMPLANT
BNDG ESMARK 4X9 LF (GAUZE/BANDAGES/DRESSINGS) ×2 IMPLANT
BNDG GAUZE ELAST 4 BULKY (GAUZE/BANDAGES/DRESSINGS) IMPLANT
CORD BIPOLAR FORCEPS 12FT (ELECTRODE) ×2 IMPLANT
COVER SURGICAL LIGHT HANDLE (MISCELLANEOUS) ×2 IMPLANT
CUFF TOURN SGL QUICK 18X4 (TOURNIQUET CUFF) ×2 IMPLANT
CUFF TOURN SGL QUICK 24 (TOURNIQUET CUFF)
CUFF TRNQT CYL 24X4X16.5-23 (TOURNIQUET CUFF) IMPLANT
DRAIN TLS ROUND 10FR (DRAIN) IMPLANT
DRAPE OEC MINIVIEW 54X84 (DRAPES) IMPLANT
DRAPE SURG 17X23 STRL (DRAPES) ×2 IMPLANT
DRSG ADAPTIC 3X8 NADH LF (GAUZE/BANDAGES/DRESSINGS) ×2 IMPLANT
GAUZE SPONGE 4X4 12PLY STRL (GAUZE/BANDAGES/DRESSINGS) ×2 IMPLANT
GAUZE XEROFORM 1X8 LF (GAUZE/BANDAGES/DRESSINGS) IMPLANT
GAUZE XEROFORM 5X9 LF (GAUZE/BANDAGES/DRESSINGS) ×2 IMPLANT
GLOVE SURG ENC TEXT LTX SZ8 (GLOVE) ×2 IMPLANT
GLOVE SURG MICRO LTX SZ8 (GLOVE) ×2 IMPLANT
GOWN STRL REUS W/ TWL LRG LVL3 (GOWN DISPOSABLE) ×1 IMPLANT
GOWN STRL REUS W/ TWL XL LVL3 (GOWN DISPOSABLE) ×1 IMPLANT
GOWN STRL REUS W/TWL LRG LVL3 (GOWN DISPOSABLE) ×2
GOWN STRL REUS W/TWL XL LVL3 (GOWN DISPOSABLE) ×2
KIT BASIN OR (CUSTOM PROCEDURE TRAY) ×2 IMPLANT
KIT INNATE INSTRUMENT FOR 3.6 (INSTRUMENTS) ×2 IMPLANT
KIT INNATE INSTRUMENT FOR 4.5 (INSTRUMENTS) ×2 IMPLANT
KIT TURNOVER KIT B (KITS) ×2 IMPLANT
MANIFOLD NEPTUNE II (INSTRUMENTS) ×2 IMPLANT
NAIL IM THRD INNATE 3.6X45 (Nail) ×2 IMPLANT
NAIL IM THRD INNATE 4.5X45 (Nail) ×2 IMPLANT
NEEDLE 22X1 1/2 (OR ONLY) (NEEDLE) IMPLANT
NS IRRIG 1000ML POUR BTL (IV SOLUTION) IMPLANT
PACK ORTHO EXTREMITY (CUSTOM PROCEDURE TRAY) ×2 IMPLANT
PAD ARMBOARD 7.5X6 YLW CONV (MISCELLANEOUS) ×4 IMPLANT
PAD CAST 3X4 CTTN HI CHSV (CAST SUPPLIES) IMPLANT
PAD CAST 4YDX4 CTTN HI CHSV (CAST SUPPLIES) ×2 IMPLANT
PADDING CAST COTTON 3X4 STRL (CAST SUPPLIES)
PADDING CAST COTTON 4X4 STRL (CAST SUPPLIES) ×4
SOL PREP POV-IOD 4OZ 10% (MISCELLANEOUS) ×4 IMPLANT
SPLINT FIBERGLASS 3X12 (CAST SUPPLIES) ×2 IMPLANT
SPONGE T-LAP 4X18 ~~LOC~~+RFID (SPONGE) ×2 IMPLANT
SUT MNCRL AB 4-0 PS2 18 (SUTURE) IMPLANT
SUT PROLENE 3 0 PS 2 (SUTURE) IMPLANT
SUT PROLENE 4 0 PS 2 18 (SUTURE) ×2 IMPLANT
SUT VIC AB 3-0 FS2 27 (SUTURE) IMPLANT
SYR CONTROL 10ML LL (SYRINGE) IMPLANT
SYSTEM CHEST DRAIN TLS 7FR (DRAIN) IMPLANT
TOWEL GREEN STERILE (TOWEL DISPOSABLE) ×2 IMPLANT
TOWEL GREEN STERILE FF (TOWEL DISPOSABLE) ×2 IMPLANT
TUBE CONNECTING 12X1/4 (SUCTIONS) ×2 IMPLANT
TUBE EVACUATION TLS (MISCELLANEOUS) IMPLANT
WATER STERILE IRR 1000ML POUR (IV SOLUTION) ×2 IMPLANT

## 2021-01-22 NOTE — Anesthesia Procedure Notes (Signed)
Procedure Name: LMA Insertion Date/Time: 01/22/2021 5:26 PM Performed by: Aundria Rud, CRNA Pre-anesthesia Checklist: Patient identified, Emergency Drugs available, Suction available and Patient being monitored Patient Re-evaluated:Patient Re-evaluated prior to induction Oxygen Delivery Method: Circle System Utilized Preoxygenation: Pre-oxygenation with 100% oxygen Induction Type: IV induction Ventilation: Mask ventilation without difficulty LMA: LMA inserted LMA Size: 4.0 Number of attempts: 1 Placement Confirmation: positive ETCO2 Tube secured with: Tape Dental Injury: Teeth and Oropharynx as per pre-operative assessment

## 2021-01-22 NOTE — Anesthesia Preprocedure Evaluation (Addendum)
Anesthesia Evaluation  Patient identified by MRN, date of birth, ID band Patient awake    Reviewed: Allergy & Precautions, NPO status , Patient's Chart, lab work & pertinent test results  History of Anesthesia Complications Negative for: history of anesthetic complications  Airway Mallampati: I  TM Distance: >3 FB Neck ROM: Full    Dental  (+) Dental Advisory Given, Teeth Intact   Pulmonary asthma , Patient abstained from smoking., former smoker,    breath sounds clear to auscultation       Cardiovascular negative cardio ROS   Rhythm:Regular Rate:Normal     Neuro/Psych PSYCHIATRIC DISORDERS (ADHD) Anxiety Depression negative neurological ROS     GI/Hepatic negative GI ROS, Neg liver ROS,   Endo/Other  negative endocrine ROS  Renal/GU negative Renal ROS     Musculoskeletal   Abdominal   Peds  Hematology negative hematology ROS (+)   Anesthesia Other Findings   Reproductive/Obstetrics                            Anesthesia Physical Anesthesia Plan  ASA: 2  Anesthesia Plan: General   Post-op Pain Management:    Induction: Intravenous  PONV Risk Score and Plan: 2 and Ondansetron and Dexamethasone  Airway Management Planned: LMA  Additional Equipment: None  Intra-op Plan:   Post-operative Plan:   Informed Consent: I have reviewed the patients History and Physical, chart, labs and discussed the procedure including the risks, benefits and alternatives for the proposed anesthesia with the patient or authorized representative who has indicated his/her understanding and acceptance.     Dental advisory given  Plan Discussed with: CRNA and Surgeon  Anesthesia Plan Comments: (Pt declines supraclavicular block, prefers GA)       Anesthesia Quick Evaluation

## 2021-01-22 NOTE — Discharge Instructions (Signed)
We recommend that you to take vitamin C 1000 mg a day to promote healing. °We also recommend that if you require  pain medicine that you take a stool softener to prevent constipation as most pain medicines will have constipation side effects. We recommend either Peri-Colace or Senokot and recommend that you also consider adding MiraLAX as well to prevent the constipation affects from pain medicine if you are required to use them. These medicines are over the counter and Hope be purchased at a local pharmacy. A cup of yogurt and a probiotic can also be helpful during the recovery process as the medicines can disrupt your intestinal environment. °Keep bandage clean and dry.  Call for any problems.  No smoking.  Criteria for driving a car: you should be off your pain medicine for 7-8 hours, able to drive one handed(confident), thinking clearly and feeling able in your judgement to drive. °Continue elevation as it will decrease swelling.  If instructed by MD move your fingers within the confines of the bandage/splint.  Use ice if instructed by your MD. Call immediately for any sudden loss of feeling in your hand/arm or change in functional abilities of the extremity. °

## 2021-01-22 NOTE — H&P (Signed)
Ricardo Stevens is an 25 y.o. male.   Chief Complaint: Displaced fourth and fifth metacarpal fractures left hand HPI: Patient presents for operative fixation left fourth and fifth metacarpal fractures about the hand  Patient presents for evaluation and treatment of the of their upper extremity predicament. The patient denies neck, back, chest or  abdominal pain. The patient notes that they have no lower extremity problems. The patients primary complaint is noted. We are planning surgical care pathway for the upper extremity.   Past Medical History:  Diagnosis Date   ADHD (attention deficit hyperactivity disorder)    no meds since high school   Anxiety    tx with effexor   Asthma    childhood, no problems as an adult, no inhaler   Cellulitis and abscess of foot 10/10/2014   Cellulitis of leg, left 10/09/2014   Concussion    in high schoold, no deficits   Depression    tx with effexor   Epigastric pain 05/12/2018   Head injury, acute, without loss of consciousness    in high school, no deficits   Mononucleosis 07/2017   Non-intractable vomiting 05/12/2018   Splenomegaly 08/18/2017    Past Surgical History:  Procedure Laterality Date   ADENOIDECTOMY     BIOPSY  07/14/2018   Procedure: BIOPSY;  Surgeon: West Bali, MD;  Location: [facility];  Service: Endoscopy;;   CIRCUMCISION     ESOPHAGOGASTRODUODENOSCOPY N/A 07/14/2018   Dr. Darrick Penna: Gastritis/duodenitis.  Small bowel biopsies benign, gastric biopsies with gastritis but no H. pylori.   TONSILLECTOMY     TYMPANOSTOMY TUBE PLACEMENT Bilateral    WISDOM TOOTH EXTRACTION      Family History  Problem Relation Age of Onset   Hypertension Mother    Diabetes Maternal Grandmother    Diabetes Maternal Grandfather    Diabetes Paternal Grandmother    Breast cancer Paternal Grandmother    Diabetes Paternal Grandfather    Social History:  reports that he quit smoking about 9 months ago. His smoking use included cigarettes. He  has a 1.00 pack-year smoking history. He has never used smokeless tobacco. He reports current alcohol use. He reports current drug use. Drug: Marijuana.  Allergies: No Known Allergies  Medications Prior to Admission  Medication Sig Dispense Refill   venlafaxine XR (EFFEXOR-XR) 37.5 MG 24 hr capsule Take 1 capsule (37.5 mg total) by mouth daily. 90 capsule 3   fluticasone (FLONASE) 50 MCG/ACT nasal spray Place 2 sprays into both nostrils daily. 16 g 6   mirtazapine (REMERON) 15 MG tablet Take 1 tablet (15 mg total) by mouth at bedtime. 30 tablet 0    No results found for this or any previous visit (from the past 48 hour(s)). No results found.  Review of Systems  Respiratory: Negative.    Cardiovascular: Negative.   Gastrointestinal: Negative.    Blood pressure (!) 144/85, pulse 77, temperature 98.2 F (36.8 C), temperature source Oral, resp. rate 18, height 5\' 6"  (1.676 m), weight 73.9 kg, SpO2 97 %. Physical Exam  We will plan for open reduction internal fixation left fourth and fifth metacarpal fractures with repair is necessary left hand.  He has a displaced fourth metacarpal fracture and 5th metacarpal fracture notable as well.  I discussed with him risk and benefits.  He desires to proceed.  The patient is alert and oriented in no acute distress. The patient complains of pain in the affected upper extremity.  The patient is noted to have a  normal HEENT exam. Lung fields show equal chest expansion and no shortness of breath. Abdomen exam is nontender without distention. Lower extremity examination does not show any fracture dislocation or blood clot symptoms. Pelvis is stable and the neck and back are stable and nontender.  Assessment/Plan Will plan for open reduction internal fixation left fourth and fifth metacarpal fractures with repair reconstruction is necessary.  We are planning surgery for your upper extremity. The risk and benefits of surgery to include risk of  bleeding, infection, anesthesia,  damage to normal structures and failure of the surgery to accomplish its intended goals of relieving symptoms and restoring function have been discussed in detail. With this in mind we plan to proceed. I have specifically discussed with the patient the pre-and postoperative regime and the dos and don'ts and risk and benefits in great detail. Risk and benefits of surgery also include risk of dystrophy(CRPS), chronic nerve pain, failure of the healing process to go onto completion and other inherent risks of surgery The relavent the pathophysiology of the disease/injury process, as well as the alternatives for treatment and postoperative course of action has been discussed in great detail with the patient who desires to proceed.  We will do everything in our power to help you (the patient) restore function to the upper extremity. It is a pleasure to see this patient today.   Oletta Cohn III, MD 01/22/2021, 5:12 PM

## 2021-01-22 NOTE — Anesthesia Postprocedure Evaluation (Signed)
Anesthesia Post Note  Patient: Ricardo Stevens  Procedure(s) Performed: OPEN REDUCTION INTERNAL FIXATION (ORIF) LEFT FOURTH METACARPAL FRACTURE AND FIFTH METACARPAL FRACTURE (Left: Finger)     Patient location during evaluation: PACU Anesthesia Type: General Level of consciousness: awake and alert Pain management: pain level controlled Vital Signs Assessment: post-procedure vital signs reviewed and stable Respiratory status: spontaneous breathing, nonlabored ventilation and respiratory function stable Cardiovascular status: blood pressure returned to baseline and stable Postop Assessment: no apparent nausea or vomiting Anesthetic complications: no   No notable events documented.  Last Vitals:  Vitals:   01/22/21 1508 01/22/21 1821  BP: (!) 144/85 138/87  Pulse: 77 96  Resp: 18 (!) 7  Temp: 36.8 C 36.5 C  SpO2: 97% 100%    Last Pain:  Vitals:   01/22/21 1821  TempSrc:   PainSc: 6                  Beryle Lathe

## 2021-01-22 NOTE — Op Note (Signed)
Operative note 01/22/2021  Ricardo Severin MD  Preoperative diagnosis displaced left fourth and fifth metacarpal fractures  Postop diagnosis the same  Operative procedure #1 open reduction internal fixation with IM rod placement left fourth metacarpal fracture #2 open reduction internal fixation left fifth metacarpal fracture with IM rod #3 stress radiography left hand 6 view performed examined and interpreted by myself.  Surgeon Ricardo Stevens  Anesthesia General  Tourniquet time 0  Complications none  Description of procedure: Patient was seen by myself and anesthesia taken to the operative theater and underwent Hibiclens scrub followed by 10-minute surgical Betadine scrub after general anesthesia was induced.  Timeout was observed body parts well-padded and the patient had fluoroscopy/mini fluoroscopy brought in and identified the fractures and the displacement.  I made a small stab incision over the fifth MCP joint with the finger flexed dissected down and on the top third introduced a K wire for the regular IM rod.  Reduction was accomplished depth gauge measurement and reaming was accomplished followed by placement of a 45 mm innate nail.  This was the nail from [facility].  This was a 4.5 x 45 mm nail.  Following this we irrigated copiously and closed the stab incision after x-rays were performed examined and noted to be in excellent position.  Following this the ring finger has a small stab incision made dissection was carried down the extensor tendon had a small perforation made followed by placement of the guidewire followed by reaming followed by placement of a 3.6 x 45 mm IM rod.  This was a innate nail from [employer].  Patient tolerated this well we irrigated copiously and closed wound with Prolene after x-rays were taken.  6 view radiographic series was performed examined and interpreted by myself and looked to be in excellent position I was pleased with this.  Patient tolerated  the procedure well.  He had a sterile bandage applied and there were no issues complications or other concerns.  The patient was taken to the recovery room in stable condition.  Once in the recovery room the patient then underwent very careful and cautious observation followed by discharge home after period of observatory care.  Patient was discharged home on Keflex Phenergan and oxycodone as needed pain  We will see him back in the office in 2 weeks splint and interval range of motion will be initiated at that time.  All questions have been addressed.  Excellent refill soft compartments and no complications were noted.  Rebecca Motta MD

## 2021-01-22 NOTE — Transfer of Care (Signed)
Immediate Anesthesia Transfer of Care Note  Patient: Ricardo Stevens  Procedure(s) Performed: OPEN REDUCTION INTERNAL FIXATION (ORIF) LEFT FOURTH METACARPAL FRACTURE AND FIFTH METACARPAL FRACTURE (Left: Finger)  Patient Location: PACU  Anesthesia Type:General  Level of Consciousness: awake, alert , drowsy and patient cooperative  Airway & Oxygen Therapy: Patient Spontanous Breathing  Post-op Assessment: Report given to RN, Post -op Vital signs reviewed and stable and Patient moving all extremities X 4  Post vital signs: Reviewed and stable  Last Vitals:  Vitals Value Taken Time  BP 138/87 01/22/21 1821  Temp    Pulse 82 01/22/21 1823  Resp 10 01/22/21 1823  SpO2 99 % 01/22/21 1823  Vitals shown include unvalidated device data.  Last Pain:  Vitals:   01/22/21 1508  TempSrc: Oral  PainSc:       Patients Stated Pain Goal: 0 (01/22/21 1458)  Complications: No notable events documented.

## 2021-01-23 ENCOUNTER — Encounter (HOSPITAL_COMMUNITY): Payer: Self-pay | Admitting: Orthopedic Surgery

## 2021-06-09 ENCOUNTER — Ambulatory Visit (INDEPENDENT_AMBULATORY_CARE_PROVIDER_SITE_OTHER): Payer: BC Managed Care – PPO | Admitting: Internal Medicine

## 2021-06-09 ENCOUNTER — Other Ambulatory Visit: Payer: Self-pay

## 2021-06-09 ENCOUNTER — Encounter: Payer: Self-pay | Admitting: Internal Medicine

## 2021-06-09 DIAGNOSIS — F418 Other specified anxiety disorders: Secondary | ICD-10-CM

## 2021-06-09 MED ORDER — BUSPIRONE HCL 7.5 MG PO TABS: 7.5000 mg | ORAL_TABLET | Freq: Two times a day (BID) | ORAL | 2 refills | Status: DC

## 2021-06-09 NOTE — Patient Instructions (Signed)
Please start taking Buspirone as prescribed.  Please stop taking Venlafaxine.

## 2021-06-09 NOTE — Assessment & Plan Note (Signed)
Anxiety uncontrolled with Effexor Switched to BuSpar 7.5 mg BID Has not been taking Remeron for a long time Would avoid introducing multiple medications at this time to see the response with BuSpar for now. Relaxation techniques -material provided

## 2021-06-09 NOTE — Progress Notes (Signed)
Virtual Visit via Telephone Note   This visit type was conducted due to national recommendations for restrictions regarding the COVID-19 Pandemic (e.g. social distancing) in an effort to limit this patient's exposure and mitigate transmission in our community.  Due to his co-morbid illnesses, this patient is at least at moderate risk for complications without adequate follow up.  This format is felt to be most appropriate for this patient at this time.  The patient did not have access to video technology/had technical difficulties with video requiring transitioning to audio format only (telephone).  All issues noted in this document were discussed and addressed.  No physical exam could be performed with this format.  Evaluation Performed:  Follow-up visit  Date:  06/09/2021   ID:  Ricardo Stevens, DOB 1996-06-28, MRN [id number]  Patient Location: Home Provider Location: Office/Clinic  Participants: Patient Location of Patient: Home Location of Provider: Telehealth Consent was obtain for visit to be over via telehealth. I verified that I am speaking with the correct person using two identifiers.  PCP:  Heather Roberts, NP   Chief Complaint: Anxiety  History of Present Illness:    Ricardo Stevens is a 25 y.o. male with PMH of depression who has a televisit for complaint of worsening of depression and anxiety symptoms for last few weeks.  He has been taking Effexor currently.  He states that it was working properly earlier, but now it does not control his anxiety symptoms.  He feels sleepy while taking Effexor.  He was placed on Remeron in the past by his psychiatrist, but has not been taking it for a long time.  He does not want to see psychiatrist for now. Denies any recent change in weight or appetite.  Denies any SI or HI currently.  The patient does not have symptoms concerning for COVID-19 infection (fever, chills, cough, or new shortness of breath).   Past Medical, Surgical, Social  History, Allergies, and Medications have been Reviewed.  Past Medical History:  Diagnosis Date   ADHD (attention deficit hyperactivity disorder)    no meds since high school   Anxiety    tx with effexor   Asthma    childhood, no problems as an adult, no inhaler   Cellulitis and abscess of foot 10/10/2014   Cellulitis of leg, left 10/09/2014   Concussion    in high schoold, no deficits   Depression    tx with effexor   Epigastric pain 05/12/2018   Head injury, acute, without loss of consciousness    in high school, no deficits   Mononucleosis 07/2017   Non-intractable vomiting 05/12/2018   Splenomegaly 08/18/2017   Past Surgical History:  Procedure Laterality Date   ADENOIDECTOMY     BIOPSY  07/14/2018   Procedure: BIOPSY;  Surgeon: West Bali, MD;  [facility];  Service: Endoscopy;;   CIRCUMCISION     ESOPHAGOGASTRODUODENOSCOPY N/A 07/14/2018   Dr. Darrick Penna: Gastritis/duodenitis.  Small bowel biopsies benign, gastric biopsies with gastritis but no H. pylori.   OPEN REDUCTION INTERNAL FIXATION (ORIF) METACARPAL Left 01/22/2021   Procedure: OPEN REDUCTION INTERNAL FIXATION (ORIF) LEFT FOURTH METACARPAL FRACTURE AND FIFTH METACARPAL FRACTURE;  Surgeon: Dominica Severin, MD;  Location: [facility];  Service: Orthopedics;  Laterality: Left;   TONSILLECTOMY     TYMPANOSTOMY TUBE PLACEMENT Bilateral    WISDOM TOOTH EXTRACTION       Current Meds  Medication Sig   busPIRone (BUSPAR) 7.5 MG tablet Take 1  tablet (7.5 mg total) by mouth 2 (two) times daily.   fluticasone (FLONASE) 50 MCG/ACT nasal spray Place 2 sprays into both nostrils daily.   [DISCONTINUED] venlafaxine XR (EFFEXOR-XR) 37.5 MG 24 hr capsule Take 1 capsule (37.5 mg total) by mouth daily.     Allergies:   Patient has no known allergies.   ROS:   Please see the history of present illness.     All other systems reviewed and are negative.   Labs/Other Tests and Data Reviewed:    Recent Labs: 08/22/2020:  BUN 14; Creatinine, Ser 1.21; Hemoglobin 15.4; Platelets 345; Potassium 3.9; Sodium 136   Recent Lipid Panel Lab Results  Component Value Date/Time   CHOL 176 05/21/2020 08:33 AM   TRIG 52 05/21/2020 08:33 AM   HDL 73 05/21/2020 08:33 AM   LDLCALC 93 05/21/2020 08:33 AM    Wt Readings from Last 3 Encounters:  01/22/21 163 lb (73.9 kg)  08/22/20 156 lb (70.8 kg)  05/28/20 158 lb (71.7 kg)     ASSESSMENT & PLAN:    Depression with anxiety Anxiety uncontrolled with Effexor Switched to BuSpar 7.5 mg BID Has not been taking Remeron for a long time Would avoid introducing multiple medications at this time to see the response with BuSpar for now. Relaxation techniques -material provided   Time:   Today, I have spent 9 minutes reviewing the chart, including problem list, medications, and with the patient with telehealth technology discussing the above problems.   Medication Adjustments/Labs and Tests Ordered: Current medicines are reviewed at length with the patient today.  Concerns regarding medicines are outlined above.   Tests Ordered: No orders of the defined types were placed in this encounter.   Medication Changes: Meds ordered this encounter  Medications   busPIRone (BUSPAR) 7.5 MG tablet    Sig: Take 1 tablet (7.5 mg total) by mouth 2 (two) times daily.    Dispense:  60 tablet    Refill:  2     Note: This dictation was prepared with Dragon dictation along with smaller phrase technology. Similar sounding words can be transcribed inadequately or Sacco not be corrected upon review. Any transcriptional errors that result from this process are unintentional.      Disposition:  Follow up  Signed, Anabel Halon, MD  06/09/2021 9:51 AM     [facility]

## 2021-06-15 ENCOUNTER — Other Ambulatory Visit: Payer: Self-pay

## 2021-06-15 ENCOUNTER — Encounter: Payer: Self-pay | Admitting: Nurse Practitioner

## 2021-06-15 ENCOUNTER — Ambulatory Visit (INDEPENDENT_AMBULATORY_CARE_PROVIDER_SITE_OTHER): Payer: BC Managed Care – PPO | Admitting: Nurse Practitioner

## 2021-06-15 ENCOUNTER — Ambulatory Visit: Payer: BC Managed Care – PPO

## 2021-06-15 DIAGNOSIS — K30 Functional dyspepsia: Secondary | ICD-10-CM | POA: Diagnosis not present

## 2021-06-15 DIAGNOSIS — R112 Nausea with vomiting, unspecified: Secondary | ICD-10-CM

## 2021-06-15 DIAGNOSIS — J069 Acute upper respiratory infection, unspecified: Secondary | ICD-10-CM | POA: Insufficient documentation

## 2021-06-15 MED ORDER — ONDANSETRON 4 MG PO TBDP: 4.0000 mg | ORAL_TABLET | Freq: Three times a day (TID) | ORAL | 0 refills | Status: DC | PRN

## 2021-06-15 MED ORDER — AMOXICILLIN-POT CLAVULANATE 875-125 MG PO TABS: 1.0000 | ORAL_TABLET | Freq: Two times a day (BID) | ORAL | 0 refills | Status: DC

## 2021-06-15 MED ORDER — NOREL AD 4-10-325 MG PO TABS: 1.0000 | ORAL_TABLET | ORAL | 1 refills | Status: DC | PRN

## 2021-06-15 NOTE — Assessment & Plan Note (Signed)
-  Rx. Augmentin; had symptoms x1 week -Rx. norel

## 2021-06-15 NOTE — Assessment & Plan Note (Signed)
-  acute with current illness -recommend mylanta OTC since it is liquid

## 2021-06-15 NOTE — Assessment & Plan Note (Signed)
-  Rx. zofran 

## 2021-06-15 NOTE — Progress Notes (Signed)
Acute Office Visit  Subjective:    Patient ID: Ricardo Stevens, male    DOB: 02-03-1996, 25 y.o.   MRN: [id number]  Chief Complaint  Patient presents with   Nausea    Nausea vomiting acid reflux,hot/cold    HPI Patient is in today for sick visit. Symptoms have been ongoing for about a week. Started with nasal congestion and cough. Then ear pain, and yesterday he started having N/V and indigestion pain. He has tried nyquil, and he slept well, but still had symptoms. 2 weeks ago he went to a birthday party, and he found out that someone there had COVID, and his girlfriend's dad had a stomach issue.  Past Medical History:  Diagnosis Date   ADHD (attention deficit hyperactivity disorder)    no meds since high school   Anxiety    tx with effexor   Asthma    childhood, no problems as an adult, no inhaler   Cellulitis and abscess of foot 10/10/2014   Cellulitis of leg, left 10/09/2014   Concussion    in high schoold, no deficits   Depression    tx with effexor   Epigastric pain 05/12/2018   Head injury, acute, without loss of consciousness    in high school, no deficits   Mononucleosis 07/2017   Non-intractable vomiting 05/12/2018   Splenomegaly 08/18/2017    Past Surgical History:  Procedure Laterality Date   ADENOIDECTOMY     BIOPSY  07/14/2018   Procedure: BIOPSY;  Surgeon: West Bali, MD;  [facility];  Service: Endoscopy;;   CIRCUMCISION     ESOPHAGOGASTRODUODENOSCOPY N/A 07/14/2018   Dr. Darrick Penna: Gastritis/duodenitis.  Small bowel biopsies benign, gastric biopsies with gastritis but no H. pylori.   OPEN REDUCTION INTERNAL FIXATION (ORIF) METACARPAL Left 01/22/2021   Procedure: OPEN REDUCTION INTERNAL FIXATION (ORIF) LEFT FOURTH METACARPAL FRACTURE AND FIFTH METACARPAL FRACTURE;  Surgeon: Dominica Severin, MD;  Location: [facility];  Service: Orthopedics;  Laterality: Left;   TONSILLECTOMY     TYMPANOSTOMY TUBE PLACEMENT Bilateral    WISDOM TOOTH EXTRACTION       Family History  Problem Relation Age of Onset   Hypertension Mother    Diabetes Maternal Grandmother    Diabetes Maternal Grandfather    Diabetes Paternal Grandmother    Breast cancer Paternal Grandmother    Diabetes Paternal Grandfather     Social History   Socioeconomic History   Marital status: Single    Spouse name: Not on file   Number of children: Not on file   Years of education: Not on file   Highest education level: Not on file  Occupational History   Occupation: Midwife    Comment: [employer]  Tobacco Use   Smoking status: Former    Packs/day: 0.50    Years: 2.00    Pack years: 1.00    Types: Cigarettes    Quit date: 03/28/2020    Years since quitting: 1.2   Smokeless tobacco: Never  Vaping Use   Vaping Use: Never used  Substance and Sexual Activity   Alcohol use: Yes    Comment: 8 beers/week   Drug use: Yes    Types: Marijuana    Comment: last saturday; not a regular occurance- patient states last use was in 09/2019   Sexual activity: Yes    Birth control/protection: Condom  Other Topics Concern   Not on file  Social History Narrative   Lives at home with Mom, Dad, and Sister. 3 dogs.  Mom & Dad smoke outside, not in front of kids.   Social Determinants of Health   Financial Resource Strain: Not on file  Food Insecurity: Not on file  Transportation Needs: Not on file  Physical Activity: Not on file  Stress: Not on file  Social Connections: Not on file  Intimate Partner Violence: Not on file    Outpatient Medications Prior to Visit  Medication Sig Dispense Refill   busPIRone (BUSPAR) 7.5 MG tablet Take 1 tablet (7.5 mg total) by mouth 2 (two) times daily. 60 tablet 2   fluticasone (FLONASE) 50 MCG/ACT nasal spray Place 2 sprays into both nostrils daily. 16 g 6   mirtazapine (REMERON) 15 MG tablet Take 1 tablet (15 mg total) by mouth at bedtime. 30 tablet 0   No facility-administered medications prior to visit.    No Known Allergies  Review  of Systems  Constitutional:  Positive for chills. Negative for fatigue and fever.  HENT:  Positive for congestion, ear pain and sneezing. Negative for rhinorrhea, sinus pressure, sinus pain and sore throat.   Respiratory:  Positive for cough. Negative for shortness of breath and wheezing.        Cough is improving      Objective:    Physical Exam  There were no vitals taken for this visit. Wt Readings from Last 3 Encounters:  01/22/21 163 lb (73.9 kg)  08/22/20 156 lb (70.8 kg)  05/28/20 158 lb (71.7 kg)    Health Maintenance Due  Topic Date Due   COVID-19 Vaccine (3 - Booster for Pfizer series) 09/01/2020   INFLUENZA VACCINE  Never done    There are no preventive care reminders to display for this patient.   No results found for: TSH Lab Results  Component Value Date   WBC 17.2 (H) 08/22/2020   HGB 15.4 08/22/2020   HCT 45.3 08/22/2020   MCV 88.0 08/22/2020   PLT 345 08/22/2020   Lab Results  Component Value Date   NA 136 08/22/2020   K 3.9 08/22/2020   CO2 24 08/22/2020   GLUCOSE 96 08/22/2020   BUN 14 08/22/2020   CREATININE 1.21 08/22/2020   BILITOT 0.3 05/21/2020   ALKPHOS 90 05/21/2020   AST 24 05/21/2020   ALT 19 05/21/2020   PROT 7.6 05/21/2020   ALBUMIN 5.3 (H) 05/21/2020   CALCIUM 9.6 08/22/2020   ANIONGAP 13 08/22/2020   Lab Results  Component Value Date   CHOL 176 05/21/2020   Lab Results  Component Value Date   HDL 73 05/21/2020   Lab Results  Component Value Date   LDLCALC 93 05/21/2020   Lab Results  Component Value Date   TRIG 52 05/21/2020   No results found for: CHOLHDL No results found for: RXVQ0G     Assessment & Plan:   Problem List Items Addressed This Visit       Respiratory   URI (upper respiratory infection)    -Rx. Augmentin; had symptoms x1 week -Rx. norel      Relevant Medications   Chlorphen-PE-Acetaminophen (NOREL AD) 4-10-325 MG TABS   amoxicillin-clavulanate (AUGMENTIN) 875-125 MG tablet      Digestive   Nausea & vomiting    -Rx. zofran      Relevant Medications   ondansetron (ZOFRAN-ODT) 4 MG disintegrating tablet     Other   Indigestion    -acute with current illness -recommend mylanta OTC since it is liquid        Meds ordered this encounter  Medications   ondansetron (ZOFRAN-ODT) 4 MG disintegrating tablet    Sig: Take 1 tablet (4 mg total) by mouth every 8 (eight) hours as needed for nausea or vomiting.    Dispense:  20 tablet    Refill:  0   Chlorphen-PE-Acetaminophen (NOREL AD) 4-10-325 MG TABS    Sig: Take 1 tablet by mouth every 4 (four) hours as needed (nasal congestion, cold symptoms).    Dispense:  20 tablet    Refill:  1   amoxicillin-clavulanate (AUGMENTIN) 875-125 MG tablet    Sig: Take 1 tablet by mouth 2 (two) times daily.    Dispense:  14 tablet    Refill:  0   Date:  06/15/2021   Location of Patient: Home Location of Provider: Office Consent was obtain for visit to be over via telehealth. I verified that I am speaking with the correct person using two identifiers.  I connected with  Jaspreet Hollings Oehler on 06/15/21 via telephone and verified that I am speaking with the correct person using two identifiers.   I discussed the limitations of evaluation and management by telemedicine. The patient expressed understanding and agreed to proceed.  Time spent: 8 min   Heather Roberts, NP

## 2021-06-16 ENCOUNTER — Telehealth: Payer: BC Managed Care – PPO | Admitting: Nurse Practitioner

## 2021-06-25 ENCOUNTER — Encounter: Payer: BC Managed Care – PPO | Admitting: Nurse Practitioner

## 2021-06-25 ENCOUNTER — Other Ambulatory Visit: Payer: Self-pay

## 2021-06-25 DIAGNOSIS — F418 Other specified anxiety disorders: Secondary | ICD-10-CM

## 2021-06-25 MED ORDER — BUSPIRONE HCL 7.5 MG PO TABS
7.5000 mg | ORAL_TABLET | Freq: Two times a day (BID) | ORAL | 2 refills | Status: DC
Start: 2021-06-25 — End: 2023-02-02

## 2021-06-25 MED ORDER — FLUTICASONE PROPIONATE 50 MCG/ACT NA SUSP: 2.0000 | Freq: Every day | NASAL | 6 refills | Status: AC

## 2022-02-11 ENCOUNTER — Ambulatory Visit (INDEPENDENT_AMBULATORY_CARE_PROVIDER_SITE_OTHER): Payer: BC Managed Care – PPO

## 2022-02-11 ENCOUNTER — Ambulatory Visit: Payer: BC Managed Care – PPO | Admitting: Orthopedic Surgery

## 2022-02-11 DIAGNOSIS — M25571 Pain in right ankle and joints of right foot: Secondary | ICD-10-CM

## 2022-03-11 ENCOUNTER — Encounter: Payer: Self-pay | Admitting: Orthopedic Surgery

## 2022-03-11 NOTE — Progress Notes (Signed)
Office Visit Note   Patient: Ricardo Stevens           Date of Birth: 11/17/1995           MRN: [id number] Visit Date: 02/11/2022              Requested by: Heather Roberts, NP [address] [place],  [place] [address] PCP: Heather Roberts, NP  Chief Complaint  Patient presents with   Right Foot - Injury      HPI: Patient is a 26 year old gentleman who is seen for acute injury right foot and ankle.  Patient states he jumped off a wall landed on his stump rolled his foot and ankle this morning.  Patient complains of pain through the ankle and arch of his foot.  Patient complains of pain with weightbearing.  Denies any bruising.  Assessment & Plan: Visit Diagnoses:  1. Pain in right ankle and joints of right foot     Plan: Patient clinically has sprained the Lisfranc complex.  We will place him in a fracture boot reevaluate in 4 weeks with three-view radiographs of the right foot.  Patient Nuzum require a CT scan.  Follow-Up Instructions: No follow-ups on file.   Ortho Exam  Patient is alert, oriented, no adenopathy, well-dressed, normal affect, normal respiratory effort. Examination patient has good pulses.  There is tenderness across the Lisfranc complex and pain with distraction across the Lisfranc complex.  The ankle is nontender to palpation anterior drawer is stable.  Imaging: No results found. No images are attached to the encounter.  Labs: Lab Results  Component Value Date   CRP <0.5 (L) 10/09/2014   REPTSTATUS 08/20/2017 FINAL 08/19/2017   CULT (A) 08/19/2017    <10,000 COLONIES/mL INSIGNIFICANT GROWTH Performed at [facility], [address] [place], [place] [address]      Lab Results  Component Value Date   ALBUMIN 5.3 (H) 05/21/2020    No results found for: "MG" No results found for: "VD25OH"  No results found for: "PREALBUMIN"    Latest Ref Rng & Units 08/22/2020    7:15 AM 05/21/2020    8:33 AM 05/12/2018   12:13 PM  CBC EXTENDED  WBC  4.0 - 10.5 K/uL 17.2  7.1  7.7   RBC 4.22 - 5.81 MIL/uL 5.15  5.16  4.77   Hemoglobin 13.0 - 17.0 g/dL 97.0  26.3  78.5   HCT 39.0 - 52.0 % 45.3  47.2  41.9   Platelets 150 - 400 K/uL 345  339  304   NEUT# 1.7 - 7.7 K/uL 14.3  3.6  4,012   Lymph# 0.7 - 4.0 K/uL 1.5  2.8  3,188      There is no height or weight on file to calculate BMI.  Orders:  Orders Placed This Encounter  Procedures   XR Ankle 2 Views Right   XR Foot 2 Views Right   No orders of the defined types were placed in this encounter.    Procedures: No procedures performed  Clinical Data: No additional findings.  ROS:  All other systems negative, except as noted in the HPI. Review of Systems  Objective: Vital Signs: There were no vitals taken for this visit.  Specialty Comments:  No specialty comments available.  PMFS History: Patient Active Problem List   Diagnosis Date Noted   URI (upper respiratory infection) 06/15/2021   Indigestion 06/15/2021   Depression with anxiety 06/09/2021   Sinusitis 08/11/2020  Otalgia of right ear 06/09/2020   Depression, recurrent (HCC) 06/09/2020   Severe episode of recurrent major depressive disorder, without psychotic features (HCC)    Nausea & vomiting 05/12/2018   Past Medical History:  Diagnosis Date   ADHD (attention deficit hyperactivity disorder)    no meds since high school   Anxiety    tx with effexor   Asthma    childhood, no problems as an adult, no inhaler   Cellulitis and abscess of foot 10/10/2014   Cellulitis of leg, left 10/09/2014   Concussion    in high schoold, no deficits   Depression    tx with effexor   Epigastric pain 05/12/2018   Head injury, acute, without loss of consciousness    in high school, no deficits   Mononucleosis 07/2017   Non-intractable vomiting 05/12/2018   Splenomegaly 08/18/2017    Family History  Problem Relation Age of Onset   Hypertension Mother    Diabetes Maternal Grandmother    Diabetes Maternal  Grandfather    Diabetes Paternal Grandmother    Breast cancer Paternal Grandmother    Diabetes Paternal Grandfather     Past Surgical History:  Procedure Laterality Date   ADENOIDECTOMY     BIOPSY  07/14/2018   Procedure: BIOPSY;  Surgeon: West Bali, MD;  [facility];  Service: Endoscopy;;   CIRCUMCISION     ESOPHAGOGASTRODUODENOSCOPY N/A 07/14/2018   Dr. Darrick Penna: Gastritis/duodenitis.  Small bowel biopsies benign, gastric biopsies with gastritis but no H. pylori.   OPEN REDUCTION INTERNAL FIXATION (ORIF) METACARPAL Left 01/22/2021   Procedure: OPEN REDUCTION INTERNAL FIXATION (ORIF) LEFT FOURTH METACARPAL FRACTURE AND FIFTH METACARPAL FRACTURE;  Surgeon: Dominica Severin, MD;  Location: [facility];  Service: Orthopedics;  Laterality: Left;   TONSILLECTOMY     TYMPANOSTOMY TUBE PLACEMENT Bilateral    WISDOM TOOTH EXTRACTION     Social History   Occupational History   Occupation: Midwife    Comment: [employer]  Tobacco Use   Smoking status: Former    Packs/day: 0.50    Years: 2.00    Total pack years: 1.00    Types: Cigarettes    Quit date: 03/28/2020    Years since quitting: 1.9   Smokeless tobacco: Never  Vaping Use   Vaping Use: Never used  Substance and Sexual Activity   Alcohol use: Yes    Comment: 8 beers/week   Drug use: Yes    Types: Marijuana    Comment: last saturday; not a regular occurance- patient states last use was in 09/2019   Sexual activity: Yes    Birth control/protection: Condom

## 2022-03-15 ENCOUNTER — Encounter: Payer: BC Managed Care – PPO | Admitting: Orthopedic Surgery

## 2022-07-12 ENCOUNTER — Encounter: Payer: Self-pay | Admitting: Family Medicine

## 2022-07-12 ENCOUNTER — Ambulatory Visit: Payer: BC Managed Care – PPO | Admitting: Family Medicine

## 2022-07-12 ENCOUNTER — Telehealth: Payer: Self-pay | Admitting: Internal Medicine

## 2022-07-12 VITALS — BP 117/73 | Temp 99.1°F | Ht 64.0 in | Wt 156.0 lb

## 2022-07-12 DIAGNOSIS — R509 Fever, unspecified: Secondary | ICD-10-CM | POA: Diagnosis not present

## 2022-07-12 DIAGNOSIS — R051 Acute cough: Secondary | ICD-10-CM

## 2022-07-12 DIAGNOSIS — R0602 Shortness of breath: Secondary | ICD-10-CM | POA: Diagnosis not present

## 2022-07-12 DIAGNOSIS — J111 Influenza due to unidentified influenza virus with other respiratory manifestations: Secondary | ICD-10-CM

## 2022-07-12 DIAGNOSIS — R112 Nausea with vomiting, unspecified: Secondary | ICD-10-CM

## 2022-07-12 DIAGNOSIS — J029 Acute pharyngitis, unspecified: Secondary | ICD-10-CM

## 2022-07-12 LAB — POCT INFLUENZA A/B
Influenza A, POC: POSITIVE — AB
Influenza B, POC: NEGATIVE

## 2022-07-12 LAB — POCT RAPID STREP A (OFFICE): Rapid Strep A Screen: NEGATIVE

## 2022-07-12 MED ORDER — ONDANSETRON HCL 4 MG PO TABS: 4.0000 mg | ORAL_TABLET | Freq: Three times a day (TID) | ORAL | 0 refills | Status: DC | PRN

## 2022-07-12 MED ORDER — OSELTAMIVIR PHOSPHATE 75 MG PO CAPS: 75.0000 mg | ORAL_CAPSULE | Freq: Two times a day (BID) | ORAL | 0 refills | Status: AC

## 2022-07-12 MED ORDER — LIDOCAINE VISCOUS HCL 2 % MT SOLN: 15.0000 mL | OROMUCOSAL | 0 refills | Status: DC | PRN

## 2022-07-12 MED ORDER — BENZONATATE 100 MG PO CAPS: 100.0000 mg | ORAL_CAPSULE | Freq: Two times a day (BID) | ORAL | 0 refills | Status: DC | PRN

## 2022-07-12 MED ORDER — ALBUTEROL SULFATE HFA 108 (90 BASE) MCG/ACT IN AERS: 2.0000 | INHALATION_SPRAY | Freq: Four times a day (QID) | RESPIRATORY_TRACT | 2 refills | Status: AC | PRN

## 2022-07-12 MED ORDER — GUAIFENESIN 100 MG/5ML PO LIQD: 5.0000 mL | ORAL | 0 refills | Status: DC | PRN

## 2022-07-12 NOTE — Patient Instructions (Signed)
Please take medications as prescribed.

## 2022-07-12 NOTE — Telephone Encounter (Signed)
Patient call benzonatate (TESSALON) 100 MG capsule not helping, coughing and throwing them up and needs something to smooth his throat to help with coughing.   Pharmacy  [id number] - [place], [place] - [address] [place] [address] Phone: [phone]  Fax: [phone]

## 2022-07-12 NOTE — Telephone Encounter (Signed)
Patient advised.

## 2022-07-12 NOTE — Assessment & Plan Note (Addendum)
Patient tested positive for Influenza A Symptoms began less than 48 hours ago  Prescribed Tamiflu 75mg 

## 2022-07-12 NOTE — Progress Notes (Signed)
New Patient Office Visit   Subjective   Patient ID: Ricardo Stevens, male    DOB: 1995/10/26  Age: 27 y.o. MRN: [id number]  CC:  Chief Complaint  Patient presents with   Emesis    Patient complains of sore throat, fever, back ache, vomiting starting yesterday.     HPI [name] presents to clinic with cold like symptoms and shortness of breath. He  has a past medical history of ADHD (attention deficit hyperactivity disorder), Anxiety, Asthma, Cellulitis and abscess of foot (10/10/2014), Cellulitis of leg, left (10/09/2014), Concussion, Depression, Epigastric pain (05/12/2018), Head injury, acute, without loss of consciousness, Mononucleosis (07/2017), Non-intractable vomiting (05/12/2018), and Splenomegaly (08/18/2017).  Influenza This is a new problem. The current episode started yesterday. The problem has been gradually worsening. Associated symptoms include chills, congestion, coughing, fatigue, a fever, nausea, a sore throat, vomiting and weakness. He has tried NSAIDs for the symptoms. The treatment provided mild relief.     Outpatient Encounter Medications as of 07/12/2022  Medication Sig   albuterol (VENTOLIN HFA) 108 (90 Base) MCG/ACT inhaler Inhale 2 puffs into the lungs every 6 (six) hours as needed for wheezing or shortness of breath.   benzonatate (TESSALON) 100 MG capsule Take 1 capsule (100 mg total) by mouth 2 (two) times daily as needed for cough.   guaiFENesin (ROBITUSSIN) 100 MG/5ML liquid Take 5 mLs by mouth every 4 (four) hours as needed for cough or to loosen phlegm.   lidocaine (XYLOCAINE) 2 % solution Use as directed 15 mLs in the mouth or throat as needed for mouth pain.   ondansetron (ZOFRAN) 4 MG tablet Take 1 tablet (4 mg total) by mouth every 8 (eight) hours as needed for nausea or vomiting.   oseltamivir (TAMIFLU) 75 MG capsule Take 1 capsule (75 mg total) by mouth 2 (two) times daily for 5 days.   amoxicillin-clavulanate (AUGMENTIN) 875-125 MG tablet Take 1  tablet by mouth 2 (two) times daily.   busPIRone (BUSPAR) 7.5 MG tablet Take 1 tablet (7.5 mg total) by mouth 2 (two) times daily.   Chlorphen-PE-Acetaminophen (NOREL AD) 4-10-325 MG TABS Take 1 tablet by mouth every 4 (four) hours as needed (nasal congestion, cold symptoms).   fluticasone (FLONASE) 50 MCG/ACT nasal spray Place 2 sprays into both nostrils daily.   mirtazapine (REMERON) 15 MG tablet Take 1 tablet (15 mg total) by mouth at bedtime.   [DISCONTINUED] ondansetron (ZOFRAN-ODT) 4 MG disintegrating tablet Take 1 tablet (4 mg total) by mouth every 8 (eight) hours as needed for nausea or vomiting.   No facility-administered encounter medications on file as of 07/12/2022.    Past Surgical History:  Procedure Laterality Date   ADENOIDECTOMY     BIOPSY  07/14/2018   Procedure: BIOPSY;  Surgeon: Danie Binder, MD;  [facility]: [facility];  Service: Endoscopy;;   CIRCUMCISION     ESOPHAGOGASTRODUODENOSCOPY N/A 07/14/2018   Dr. Oneida Alar: Gastritis/duodenitis.  Small bowel biopsies benign, gastric biopsies with gastritis but no H. pylori.   OPEN REDUCTION INTERNAL FIXATION (ORIF) METACARPAL Left 01/22/2021   Procedure: OPEN REDUCTION INTERNAL FIXATION (ORIF) LEFT FOURTH METACARPAL FRACTURE AND FIFTH METACARPAL FRACTURE;  Surgeon: Roseanne Kaufman, MD;  Location: [facility];  Service: Orthopedics;  Laterality: Left;   TONSILLECTOMY     TYMPANOSTOMY TUBE PLACEMENT Bilateral    WISDOM TOOTH EXTRACTION      Review of Systems  Constitutional:  Positive for chills, fatigue and fever.  HENT:  Positive for congestion and sore throat.  Respiratory:  Positive for cough.   Gastrointestinal:  Positive for nausea and vomiting.  Neurological:  Positive for weakness.      Objective    BP 117/73   Temp 99.1 F (37.3 C)   Ht 5\' 4"  (1.626 m)   Wt 156 lb (70.8 kg)   SpO2 97%   BMI 26.78 kg/m   Physical Exam HENT:     Head: Normocephalic.     Right Ear: Tympanic membrane normal.     Left Ear:  Tympanic membrane normal.  Cardiovascular:     Rate and Rhythm: Normal rate.  Pulmonary:     Effort: Pulmonary effort is normal. No respiratory distress.     Breath sounds: Normal breath sounds.  Skin:    General: Skin is warm and dry.  Neurological:     Mental Status: He is alert. Mental status is at baseline.  Psychiatric:        Mood and Affect: Mood normal.       Assessment & Plan:  Influenza Assessment & Plan: Patient tested positive for Influenza A Symptoms began less than 48 hours ago  Prescribed Tamiflu 75mg     Fever, unspecified fever cause -     Novel Coronavirus, NAA ([facility]) -     POCT Influenza A/B -     POCT rapid strep A  Acute cough -     Benzonatate; Take 1 capsule (100 mg total) by mouth 2 (two) times daily as needed for cough.  Dispense: 20 capsule; Refill: 0 -     guaiFENesin; Take 5 mLs by mouth every 4 (four) hours as needed for cough or to loosen phlegm.  Dispense: 120 mL; Refill: 0  SOB (shortness of breath) -     Albuterol Sulfate HFA; Inhale 2 puffs into the lungs every 6 (six) hours as needed for wheezing or shortness of breath.  Dispense: 8 g; Refill: 2  Flu -     Oseltamivir Phosphate; Take 1 capsule (75 mg total) by mouth 2 (two) times daily for 5 days.  Dispense: 10 capsule; Refill: 0  Nausea and vomiting, unspecified vomiting type -     Ondansetron HCl; Take 1 tablet (4 mg total) by mouth every 8 (eight) hours as needed for nausea or vomiting.  Dispense: 20 tablet; Refill: 0  Sore throat -     Lidocaine Viscous HCl; Use as directed 15 mLs in the mouth or throat as needed for mouth pain.  Dispense: 100 mL; Refill: 0    No follow-ups on file.   Renard Hamper Ria Comment, FNP

## 2022-07-14 LAB — NOVEL CORONAVIRUS, NAA: SARS-CoV-2, NAA: NOT DETECTED

## 2022-08-09 ENCOUNTER — Telehealth: Payer: Self-pay

## 2022-08-09 NOTE — Transitions of Care (Post Inpatient/ED Visit) (Unsigned)
   08/09/2022  Name: Ricardo Stevens MRN: [id number] DOB: 1995/09/27  Today's TOC FU Call Status: Today's TOC FU Call Status:: Unsuccessul Call (1st Attempt) Unsuccessful Call (1st Attempt) Date: 08/09/22  Attempted to reach the patient regarding the most recent Inpatient/ED visit.  Follow Up Plan: Additional outreach attempts will be made to reach the patient to complete the Transitions of Care (Post Inpatient/ED visit) call.  Juanda Crumble, [place] Nurse Health Advisor Direct Dial [phone]  Signature ***

## 2022-08-10 NOTE — Transitions of Care (Post Inpatient/ED Visit) (Unsigned)
   08/10/2022  Name: Ricardo Stevens MRN: [id number] DOB: [birth year]  Today's TOC FU Call Status: Today's TOC FU Call Status:: Unsuccessul Call (1st Attempt) Unsuccessful Call (1st Attempt) Date: 08/09/22  Attempted to reach the patient regarding the most recent Inpatient/ED visit.  Follow Up Plan: Additional outreach attempts will be made to reach the patient to complete the Transitions of Care (Post Inpatient/ED visit) call.   Signature Juanda Crumble, [place] Direct Dial [phone]

## 2022-08-12 NOTE — Transitions of Care (Post Inpatient/ED Visit) (Signed)
   08/12/2022  Name: Ricardo Stevens MRN: [id number] DOB: [birth year]  Today's TOC FU Call Status: Today's TOC FU Call Status:: Unsuccessful Call (3rd Attempt) Unsuccessful Call (1st Attempt) Date: 08/09/22 Unsuccessful Call (3rd Attempt) Date: 08/12/22  Attempted to reach the patient regarding the most recent Inpatient/ED visit.  Follow Up Plan: No further outreach attempts will be made at this time. We have been unable to contact the patient.  Signature Juanda Crumble, [place] Direct Dial [phone]

## 2023-02-02 ENCOUNTER — Encounter: Payer: Self-pay | Admitting: Internal Medicine

## 2023-02-02 ENCOUNTER — Ambulatory Visit (INDEPENDENT_AMBULATORY_CARE_PROVIDER_SITE_OTHER): Payer: BC Managed Care – PPO | Admitting: Internal Medicine

## 2023-02-02 VITALS — BP 134/84 | HR 68 | Ht 64.0 in | Wt 161.4 lb

## 2023-02-02 DIAGNOSIS — Z0001 Encounter for general adult medical examination with abnormal findings: Secondary | ICD-10-CM | POA: Insufficient documentation

## 2023-02-02 DIAGNOSIS — J4520 Mild intermittent asthma, uncomplicated: Secondary | ICD-10-CM | POA: Diagnosis not present

## 2023-02-02 DIAGNOSIS — K2950 Unspecified chronic gastritis without bleeding: Secondary | ICD-10-CM | POA: Insufficient documentation

## 2023-02-02 DIAGNOSIS — E782 Mixed hyperlipidemia: Secondary | ICD-10-CM | POA: Diagnosis not present

## 2023-02-02 MED ORDER — FAMOTIDINE 20 MG PO TABS
20.0000 mg | ORAL_TABLET | Freq: Every day | ORAL | 3 refills | Status: AC | PRN
Start: 2023-02-02 — End: ?

## 2023-02-02 NOTE — Patient Instructions (Signed)
Please take Vitamin D 2000 IU once daily and Vitamin B12 500 mcg once daily.

## 2023-02-02 NOTE — Assessment & Plan Note (Signed)
Physical exam as documented. Fasting blood tests today. 

## 2023-02-02 NOTE — Assessment & Plan Note (Signed)
Well controlled with as needed albuterol 

## 2023-02-02 NOTE — Progress Notes (Signed)
Established Patient Office Visit  Subjective:  Patient ID: Ricardo Stevens, male    DOB: [birth year]  Age: 27 y.o. MRN: [id number]  CC:  Chief Complaint  Patient presents with   Annual Exam    HPI Ricardo Stevens is a 27 y.o. male with past medical history of MDD and asthma who presents for annual physical.  He uses an albuterol inhaler as needed for dyspnea.  He requires it rarely.  Denies any chronic cough or wheezing currently.  He has episodes of vomiting at times after eating specific food, such as rice in [place] or [place] food.  He has tried to avoid those foods.    He has had GI evaluation and had EGD in 01/20, which showed gastritis.  He has history of MDD and GAD.  He used to see Psychiatrist and was placed on Effexor and Remeron in the past.  He has been doing well without medicines currently.  He works for Therapist, occupational for [employer].  Denies any anhedonia, spells of anxiety or insomnia.  Past Medical History:  Diagnosis Date   ADHD (attention deficit hyperactivity disorder)    no meds since high school   Anxiety    tx with effexor   Asthma    childhood, no problems as an adult, no inhaler   Cellulitis and abscess of foot 10/10/2014   Cellulitis of leg, left 10/09/2014   Concussion    in high schoold, no deficits   Depression    tx with effexor   Epigastric pain 05/12/2018   Head injury, acute, without loss of consciousness    in high school, no deficits   Mononucleosis 07/2017   Non-intractable vomiting 05/12/2018   Splenomegaly 08/18/2017    Past Surgical History:  Procedure Laterality Date   ADENOIDECTOMY     BIOPSY  07/14/2018   Procedure: BIOPSY;  Surgeon: West Bali, MD;  [facility]: [facility];  Service: Endoscopy;;   CIRCUMCISION     ESOPHAGOGASTRODUODENOSCOPY N/A 07/14/2018   Dr. Darrick Penna: Gastritis/duodenitis.  Small bowel biopsies benign, gastric biopsies with gastritis but no H. pylori.   OPEN REDUCTION INTERNAL FIXATION (ORIF) METACARPAL  Left 01/22/2021   Procedure: OPEN REDUCTION INTERNAL FIXATION (ORIF) LEFT FOURTH METACARPAL FRACTURE AND FIFTH METACARPAL FRACTURE;  Surgeon: Dominica Severin, MD;  Location: [facility];  Service: Orthopedics;  Laterality: Left;   TONSILLECTOMY     TYMPANOSTOMY TUBE PLACEMENT Bilateral    WISDOM TOOTH EXTRACTION      Family History  Problem Relation Age of Onset   Hypertension Mother    Diabetes Maternal Grandmother    Diabetes Maternal Grandfather    Diabetes Paternal Grandmother    Breast cancer Paternal Grandmother    Diabetes Paternal Grandfather     Social History   Socioeconomic History   Marital status: Single    Spouse name: Not on file   Number of children: Not on file   Years of education: Not on file   Highest education level: Not on file  Occupational History   Occupation: Midwife    Comment: [employer]  Tobacco Use   Smoking status: Former    Current packs/day: 0.00    Average packs/day: 0.5 packs/day for 2.0 years (1.0 ttl pk-yrs)    Types: Cigarettes    Start date: 03/28/2018    Quit date: 03/28/2020    Years since quitting: 2.8   Smokeless tobacco: Never  Vaping Use   Vaping status: Never Used  Substance and Sexual Activity   Alcohol  use: Yes    Comment: 8 beers/week   Drug use: Yes    Types: Marijuana    Comment: last saturday; not a regular occurance- patient states last use was in 09/2019   Sexual activity: Yes    Birth control/protection: Condom  Other Topics Concern   Not on file  Social History Narrative   Lives at home with Mom, Dad, and Sister. 3 dogs. Mom & Dad smoke outside, not in front of kids.   Social Determinants of Health   Financial Resource Strain: Not on file  Food Insecurity: Not on file  Transportation Needs: Not on file  Physical Activity: Not on file  Stress: Not on file  Social Connections: Not on file  Intimate Partner Violence: Not on file    Outpatient Medications Prior to Visit  Medication Sig Dispense Refill   albuterol  (VENTOLIN HFA) 108 (90 Base) MCG/ACT inhaler Inhale 2 puffs into the lungs every 6 (six) hours as needed for wheezing or shortness of breath. 8 g 2   fluticasone (FLONASE) 50 MCG/ACT nasal spray Place 2 sprays into both nostrils daily. 16 g 6   amoxicillin-clavulanate (AUGMENTIN) 875-125 MG tablet Take 1 tablet by mouth 2 (two) times daily. 14 tablet 0   benzonatate (TESSALON) 100 MG capsule Take 1 capsule (100 mg total) by mouth 2 (two) times daily as needed for cough. 20 capsule 0   busPIRone (BUSPAR) 7.5 MG tablet Take 1 tablet (7.5 mg total) by mouth 2 (two) times daily. 60 tablet 2   Chlorphen-PE-Acetaminophen (NOREL AD) 4-10-325 MG TABS Take 1 tablet by mouth every 4 (four) hours as needed (nasal congestion, cold symptoms). 20 tablet 1   guaiFENesin (ROBITUSSIN) 100 MG/5ML liquid Take 5 mLs by mouth every 4 (four) hours as needed for cough or to loosen phlegm. 120 mL 0   lidocaine (XYLOCAINE) 2 % solution Use as directed 15 mLs in the mouth or throat as needed for mouth pain. 100 mL 0   mirtazapine (REMERON) 15 MG tablet Take 1 tablet (15 mg total) by mouth at bedtime. 30 tablet 0   ondansetron (ZOFRAN) 4 MG tablet Take 1 tablet (4 mg total) by mouth every 8 (eight) hours as needed for nausea or vomiting. 20 tablet 0   No facility-administered medications prior to visit.    No Known Allergies  ROS Review of Systems  Constitutional:  Negative for chills and fever.  HENT:  Negative for congestion and sore throat.   Eyes:  Negative for pain and discharge.  Respiratory:  Negative for cough and shortness of breath.   Cardiovascular:  Negative for chest pain and palpitations.  Gastrointestinal:  Positive for nausea. Negative for constipation, diarrhea and vomiting.  Endocrine: Negative for polydipsia and polyuria.  Genitourinary:  Negative for dysuria and hematuria.  Musculoskeletal:  Negative for neck pain and neck stiffness.  Skin:  Negative for rash.  Neurological:  Negative for  dizziness, weakness, numbness and headaches.  Psychiatric/Behavioral:  Negative for agitation and behavioral problems.       Objective:    Physical Exam Vitals reviewed.  Constitutional:      General: He is not in acute distress.    Appearance: He is not diaphoretic.  HENT:     Head: Normocephalic and atraumatic.     Nose: Nose normal.     Mouth/Throat:     Mouth: Mucous membranes are moist.  Eyes:     General: No scleral icterus.    Extraocular Movements: Extraocular movements intact.  Cardiovascular:     Rate and Rhythm: Normal rate and regular rhythm.     Heart sounds: No murmur heard. Pulmonary:     Breath sounds: Normal breath sounds. No wheezing or rales.  Abdominal:     Palpations: Abdomen is soft.     Tenderness: There is no abdominal tenderness.  Musculoskeletal:     Cervical back: Neck supple. No tenderness.     Right lower leg: No edema.     Left lower leg: No edema.  Skin:    General: Skin is warm.     Findings: No rash.  Neurological:     General: No focal deficit present.     Mental Status: He is alert and oriented to person, place, and time.     Cranial Nerves: No cranial nerve deficit.     Sensory: No sensory deficit.  Psychiatric:        Mood and Affect: Mood normal.        Behavior: Behavior normal.     BP 134/84 (BP Location: Right Arm)   Pulse 68   Ht 5\' 4"  (1.626 m)   Wt 161 lb 6.4 oz (73.2 kg)   SpO2 96%   BMI 27.70 kg/m  Wt Readings from Last 3 Encounters:  02/02/23 161 lb 6.4 oz (73.2 kg)  07/12/22 156 lb (70.8 kg)  01/22/21 163 lb (73.9 kg)    No results found for: "TSH" Lab Results  Component Value Date   WBC 17.2 (H) 08/22/2020   HGB 15.4 08/22/2020   HCT 45.3 08/22/2020   MCV 88.0 08/22/2020   PLT 345 08/22/2020   Lab Results  Component Value Date   NA 136 08/22/2020   K 3.9 08/22/2020   CO2 24 08/22/2020   GLUCOSE 96 08/22/2020   BUN 14 08/22/2020   CREATININE 1.21 08/22/2020   BILITOT 0.3 05/21/2020   ALKPHOS  90 05/21/2020   AST 24 05/21/2020   ALT 19 05/21/2020   PROT 7.6 05/21/2020   ALBUMIN 5.3 (H) 05/21/2020   CALCIUM 9.6 08/22/2020   ANIONGAP 13 08/22/2020   Lab Results  Component Value Date   CHOL 176 05/21/2020   Lab Results  Component Value Date   HDL 73 05/21/2020   Lab Results  Component Value Date   LDLCALC 93 05/21/2020   Lab Results  Component Value Date   TRIG 52 05/21/2020   No results found for: "CHOLHDL" No results found for: "HGBA1C"    Assessment & Plan:   Problem List Items Addressed This Visit       Respiratory   Mild intermittent asthma without complication    Well controlled with as needed albuterol      Relevant Orders   CBC with Differential/Platelet   CMP14+EGFR     Digestive   Chronic gastritis without bleeding    Has nausea and vomiting after eating specific food Avoid hot and spicy food Pepcid as needed for acid reflux/indigestion      Relevant Medications   famotidine (PEPCID) 20 MG tablet     Other   Encounter for general adult medical examination with abnormal findings - Primary    Physical exam as documented. Fasting blood tests today.      Other Visit Diagnoses     Mixed hyperlipidemia       Relevant Orders   Lipid Profile       Meds ordered this encounter  Medications   famotidine (PEPCID) 20 MG tablet    Sig: Take 1  tablet (20 mg total) by mouth daily as needed for heartburn or indigestion.    Dispense:  30 tablet    Refill:  3    Follow-up: Return in about 1 year (around 02/02/2024) for Annual physical.    Anabel Halon, MD

## 2023-02-02 NOTE — Assessment & Plan Note (Signed)
Has nausea and vomiting after eating specific food Avoid hot and spicy food Pepcid as needed for acid reflux/indigestion

## 2023-03-10 ENCOUNTER — Encounter (HOSPITAL_BASED_OUTPATIENT_CLINIC_OR_DEPARTMENT_OTHER): Payer: Self-pay | Admitting: Student

## 2023-03-10 ENCOUNTER — Ambulatory Visit (HOSPITAL_BASED_OUTPATIENT_CLINIC_OR_DEPARTMENT_OTHER): Payer: BC Managed Care – PPO | Admitting: Student

## 2023-03-10 ENCOUNTER — Ambulatory Visit (HOSPITAL_BASED_OUTPATIENT_CLINIC_OR_DEPARTMENT_OTHER): Payer: BC Managed Care – PPO

## 2023-03-10 DIAGNOSIS — S62621A Displaced fracture of medial phalanx of left index finger, initial encounter for closed fracture: Secondary | ICD-10-CM | POA: Diagnosis not present

## 2023-03-10 DIAGNOSIS — M79645 Pain in left finger(s): Secondary | ICD-10-CM | POA: Diagnosis not present

## 2023-03-10 NOTE — Progress Notes (Signed)
Chief Complaint: Left index finger pain     History of Present Illness:    Ricardo Stevens is a 27 y.o. male presents today for evaluation of his left index finger.  He states that last night he accidentally shut it in a door.  Since then he has been developing swelling and bruising over the PIP joint and proximal index finger.  Pain levels are moderate and he has not taken any pain medication other than Tylenol last.  He does have a little bit of tingling in the distal finger.  He did have previous ORIF of the fourth and fifth metacarpals in 2022 with Dr. Amanda Pea.  He works for the [employer] education [employer].   Surgical History:   Left 4th/5th metacarpal ORIF - 01/22/2021  PMH/PSH/Family History/Social History/Meds/Allergies:    Past Medical History:  Diagnosis Date   ADHD (attention deficit hyperactivity disorder)    no meds since high school   Anxiety    tx with effexor   Asthma    childhood, no problems as an adult, no inhaler   Cellulitis and abscess of foot 10/10/2014   Cellulitis of leg, left 10/09/2014   Concussion    in high schoold, no deficits   Depression    tx with effexor   Epigastric pain 05/12/2018   Head injury, acute, without loss of consciousness    in high school, no deficits   Mononucleosis 07/2017   Non-intractable vomiting 05/12/2018   Splenomegaly 08/18/2017   Past Surgical History:  Procedure Laterality Date   ADENOIDECTOMY     BIOPSY  07/14/2018   Procedure: BIOPSY;  Surgeon: West Bali, MD;  [facility];  Service: Endoscopy;;   CIRCUMCISION     ESOPHAGOGASTRODUODENOSCOPY N/A 07/14/2018   Dr. Darrick Penna: Gastritis/duodenitis.  Small bowel biopsies benign, gastric biopsies with gastritis but no H. pylori.   OPEN REDUCTION INTERNAL FIXATION (ORIF) METACARPAL Left 01/22/2021   Procedure: OPEN REDUCTION INTERNAL FIXATION (ORIF) LEFT FOURTH METACARPAL FRACTURE AND FIFTH METACARPAL FRACTURE;   Surgeon: Dominica Severin, MD;  Location: [facility];  Service: Orthopedics;  Laterality: Left;   TONSILLECTOMY     TYMPANOSTOMY TUBE PLACEMENT Bilateral    WISDOM TOOTH EXTRACTION     Social History   Socioeconomic History   Marital status: Single    Spouse name: Not on file   Number of children: Not on file   Years of education: Not on file   Highest education level: Not on file  Occupational History   Occupation: Midwife    Comment: [employer]  Tobacco Use   Smoking status: Former    Current packs/day: 0.00    Average packs/day: 0.5 packs/day for 2.0 years (1.0 ttl pk-yrs)    Types: Cigarettes    Start date: 03/28/2018    Quit date: 03/28/2020    Years since quitting: 2.9   Smokeless tobacco: Never  Vaping Use   Vaping status: Never Used  Substance and Sexual Activity   Alcohol use: Yes    Comment: 8 beers/week   Drug use: Yes    Types: Marijuana    Comment: last saturday; not a regular occurance- patient states last use was in 09/2019   Sexual activity: Yes    Birth control/protection: Condom  Other Topics Concern   Not on file  Social History  Narrative   Lives at home with Mom, Dad, and Sister. 3 dogs. Mom & Dad smoke outside, not in front of kids.   Social Determinants of Health   Financial Resource Strain: Not on file  Food Insecurity: Not on file  Transportation Needs: Not on file  Physical Activity: Not on file  Stress: Not on file  Social Connections: Not on file   Family History  Problem Relation Age of Onset   Hypertension Mother    Diabetes Maternal Grandmother    Diabetes Maternal Grandfather    Diabetes Paternal Grandmother    Breast cancer Paternal Grandmother    Diabetes Paternal Grandfather    No Known Allergies Current Outpatient Medications  Medication Sig Dispense Refill   albuterol (VENTOLIN HFA) 108 (90 Base) MCG/ACT inhaler Inhale 2 puffs into the lungs every 6 (six) hours as needed for wheezing or shortness of breath. 8 g 2   famotidine  (PEPCID) 20 MG tablet Take 1 tablet (20 mg total) by mouth daily as needed for heartburn or indigestion. 30 tablet 3   fluticasone (FLONASE) 50 MCG/ACT nasal spray Place 2 sprays into both nostrils daily. 16 g 6   No current facility-administered medications for this visit.   No results found.  Review of Systems:   A ROS was performed including pertinent positives and negatives as documented in the HPI.  Physical Exam :   Constitutional: NAD and appears stated age Neurological: Alert and oriented Psych: Appropriate affect and cooperative There were no vitals taken for this visit.   Comprehensive Musculoskeletal Exam:    Swelling and ecchymosis over the proximal phalanx and PIP joint of the left index finger.  PIP joint is tender to palpation.  Flexor and extensor mechanisms of the PIP and DIP are intact.  Reduced flexion of the index finger when forming a fist with no notable rotational deformity.  Radial pulse 2+.  Neurosensory exam intact.  Imaging:   Xray (left index finger 3 views): Minimally displaced oblique fracture of the middle phalanx extending into the PIP joint.    I personally reviewed and interpreted the radiographs.   Assessment:   27 y.o. male with a left index finger fracture of the middle phalanx.  The fracture does extend to the PIP joint, however it involves less than 25% of the articular surface and shows minimal displacement.  Based on this, I believe we can manage nonoperatively with immobilization.  Will place him in a removable radial gutter splint today and plan to have him return for evaluation in 3 weeks with myself for our hand specialist Dr. Denese Killings.  Recommend to take Tylenol and ibuprofen as needed for pain control.  Plan :    - Placed in radial gutter removable splint - Follow up in 3 weeks with Dr. Denese Killings     I personally saw and evaluated the patient, and participated in the management and treatment plan.  Hazle Nordmann,  PA-C Orthopedics

## 2023-03-11 ENCOUNTER — Other Ambulatory Visit (HOSPITAL_BASED_OUTPATIENT_CLINIC_OR_DEPARTMENT_OTHER): Payer: Self-pay

## 2023-03-11 ENCOUNTER — Other Ambulatory Visit (HOSPITAL_BASED_OUTPATIENT_CLINIC_OR_DEPARTMENT_OTHER): Payer: Self-pay | Admitting: Student

## 2023-03-11 ENCOUNTER — Telehealth (HOSPITAL_BASED_OUTPATIENT_CLINIC_OR_DEPARTMENT_OTHER): Payer: Self-pay

## 2023-03-11 MED ORDER — ACETAMINOPHEN-CODEINE 300-30 MG PO TABS
1.0000 | ORAL_TABLET | ORAL | 0 refills | Status: AC | PRN
Start: 2023-03-11 — End: ?

## 2023-03-11 NOTE — Telephone Encounter (Signed)
Pt stated he needed something for pain. Ricardo Stevens he was having a hard time due to pain last night. He is still working so I told him during the day he will need to continue with the tylenol and ice but I would have jackson send something in for when he gets home. He stated understanding

## 2023-04-04 ENCOUNTER — Ambulatory Visit: Payer: BC Managed Care – PPO | Admitting: Orthopedic Surgery

## 2023-04-22 ENCOUNTER — Telehealth: Payer: BC Managed Care – PPO | Admitting: Physician Assistant

## 2023-04-22 DIAGNOSIS — L255 Unspecified contact dermatitis due to plants, except food: Secondary | ICD-10-CM | POA: Diagnosis not present

## 2023-04-22 MED ORDER — PREDNISONE 10 MG PO TABS: ORAL_TABLET | ORAL | 0 refills | Status: AC

## 2023-04-22 NOTE — Progress Notes (Signed)
E-Visit for Poison Ivy  We are sorry that you are not feeing well.  Here is how we plan to help!  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  This resin is very sticky, so it easily attaches to your skin, clothing, tools equipment, and pet's fur.    This blistering rash is often called poison ivy rash although it can come from contact with the leaves, stems and roots of poison ivy, poison oak and poison sumac.  The oily resin contains urushiol (u-ROO-she-ol) that produces a skin rash on exposed skin.  The severity of the rash depends on the amount of urushiol that gets on your skin.  A section of skin with more urushiol on it may develop a rash sooner.  The rash usually develops 12-48 hours after exposure and can last two to three weeks.  Your skin must come in direct contact with the plant's oil to be affected.  Blister fluid doesn't spread the rash.  However, if you come into contact with a piece of clothing or pet fur that has urushiol on it, the rash may spread out.  You can also transfer the oil to other parts of your body with your fingers.  Often the rash looks like a straight line because of the way the plant brushes against your skin.  Since your rash is widespread or has resulted in a large number of blisters, I have prescribed an oral corticosteroid.  Please follow these recommendations:  I have sent a prednisone dose pack to your chosen pharmacy. Be sure to follow the instructions carefully and complete the entire prescription. You may use Benadryl or Caladryl topical lotions to sooth the itch and remember cool, not hot, showers and baths can help relieve the itching!  Place cool, wet compresses on the affected area for 15-30 minutes several times a day.  You may also take oral antihistamines, such as diphenhydramine (Benadryl, others), which may also help you sleep better.  Watch your skin for any purulent  (pus) drainage or red streaking from the site.  If this occurs, contact your provider.  You may require an antibiotic for a skin infection.  Make sure that the clothes you were wearing as well as any towels or sheets that may have come in contact with the oil (urushiol) are washed in detergent and hot water.       I have developed the following plan to treat your condition I am prescribing a two week course of steroids (37 tablets of 10 mg prednisone).  Days 1-4 take 4 tablets (40 mg) daily  Days 5-8 take 3 tablets (30 mg) daily, Days 9-11 take 2 tablets (20 mg) daily, Days 12-14 take 1 tablet (10 mg) daily.    What can you do to prevent this rash?  Avoid the plants.  Learn how to identify poison ivy, poison oak and poison sumac in all seasons.  When hiking or engaging in other activities that might expose you to these plants, try to stay on cleared pathways.  If camping, make sure you pitch your tent in an area free of these plants.  Keep pets from running through wooded areas so that urushiol doesn't accidentally stick to their fur, which you may touch.  Remove or kill the plants.  In your yard, you can get rid of poison ivy by applying an herbicide or pulling it out of   the ground, including the roots, while wearing heavy gloves.  Afterward remove the gloves and thoroughly wash them and your hands.  Don't burn poison ivy or related plants because the urushiol can be carried by smoke.  Wear protective clothing.  If needed, protect your skin by wearing socks, boots, pants, long sleeves and vinyl gloves.  Wash your skin right away.  Washing off the oil with soap and water within 30 minutes of exposure may reduce your chances of getting a poison ivy rash.  Even washing after an hour or so can help reduce the severity of the rash.  If you walk through some poison ivy and then later touch your shoes, you may get some urushiol on your hands, which may then transfer to your face or body by touching or  rubbing.  If the contaminated object isn't cleaned, the urushiol on it can still cause a skin reaction years later.    Be careful not to reuse towels after you have washed your skin.  Also carefully wash clothing in detergent and hot water to remove all traces of the oil.  Handle contaminated clothing carefully so you don't transfer the urushiol to yourself, furniture, rugs or appliances.  Remember that pets can carry the oil on their fur and paws.  If you think your pet may be contaminated with urushiol, put on some long rubber gloves and give your pet a bath.  Finally, be careful not to burn these plants as the smoke can contain traces of the oil.  Inhaling the smoke may result in difficulty breathing. If that occurred you should see a physician as soon as possible.  See your doctor right away if:  The reaction is severe or widespread You inhaled the smoke from burning poison ivy and are having difficulty breathing Your skin continues to swell The rash affects your eyes, mouth or genitals Blisters are oozing pus You develop a fever greater than 100 F (37.8 C) The rash doesn't get better within a few weeks.  If you scratch the poison ivy rash, bacteria under your fingernails may cause the skin to become infected.  See your doctor if pus starts oozing from the blisters.  Treatment generally includes antibiotics.  Poison ivy treatments are usually limited to self-care methods.  And the rash typically goes away on its own in two to three weeks.     If the rash is widespread or results in a large number of blisters, your doctor may prescribe an oral corticosteroid, such as prednisone.  If a bacterial infection has developed at the rash site, your doctor may give you a prescription for an oral antibiotic.  MAKE SURE YOU  Understand these instructions. Will watch your condition. Will get help right away if you are not doing well or get worse.   Thank you for choosing an e-visit.  Your  e-visit answers were reviewed by a board certified advanced clinical practitioner to complete your personal care plan. Depending upon the condition, your plan could have included both over the counter or prescription medications.  Please review your pharmacy choice. Make sure the pharmacy is open so you can pick up prescription now. If there is a problem, you may contact your provider through MyChart messaging and have the prescription routed to another pharmacy.  Your safety is important to us. If you have drug allergies check your prescription carefully.   For the next 24 hours you can use MyChart to ask questions about today's visit, request a non-urgent   call back, or ask for a work or school excuse. You will get an email in the next two days asking about your experience. I hope that your e-visit has been valuable and will speed your recovery.   I have spent 5 minutes in review of e-visit questionnaire, review and updating patient chart, medical decision making and response to patient.   Elisama Thissen M Malone Admire, PA-C  

## 2023-11-16 ENCOUNTER — Ambulatory Visit: Payer: Self-pay | Admitting: Internal Medicine

## 2024-02-07 ENCOUNTER — Encounter: Payer: BC Managed Care – PPO | Admitting: Internal Medicine

## 2024-02-14 ENCOUNTER — Encounter: Payer: Self-pay | Admitting: Internal Medicine

## 2024-06-07 ENCOUNTER — Ambulatory Visit: Payer: Self-pay | Admitting: *Deleted

## 2024-06-07 ENCOUNTER — Ambulatory Visit
Admission: RE | Admit: 2024-06-07 | Discharge: 2024-06-07 | Disposition: A | Discharge status: Home / Self Care | Source: Ambulatory Visit | Attending: Nurse Practitioner

## 2024-06-07 VITALS — BP 138/88 | HR 82 | Temp 99.7°F | Resp 18

## 2024-06-07 DIAGNOSIS — B349 Viral infection, unspecified: Secondary | ICD-10-CM | POA: Diagnosis not present

## 2024-06-07 DIAGNOSIS — J029 Acute pharyngitis, unspecified: Secondary | ICD-10-CM | POA: Diagnosis not present

## 2024-06-07 DIAGNOSIS — R1031 Right lower quadrant pain: Secondary | ICD-10-CM | POA: Diagnosis not present

## 2024-06-07 LAB — POCT URINE DIPSTICK
Bilirubin, UA: NEGATIVE
Blood, UA: NEGATIVE
Glucose, UA: NEGATIVE mg/dL
Leukocytes, UA: NEGATIVE
Nitrite, UA: NEGATIVE
POC PROTEIN,UA: NEGATIVE
Spec Grav, UA: 1.025 (ref 1.010–1.025)
Urobilinogen, UA: 0.2 U/dL
pH, UA: 5.5 (ref 5.0–8.0)

## 2024-06-07 LAB — POCT RAPID STREP A (OFFICE): Rapid Strep A Screen: NEGATIVE

## 2024-06-07 LAB — POC COVID19/FLU A&B COMBO
Covid Antigen, POC: NEGATIVE
Influenza A Antigen, POC: NEGATIVE
Influenza B Antigen, POC: NEGATIVE

## 2024-06-07 MED ORDER — ONDANSETRON 8 MG PO TBDP: 8.0000 mg | ORAL_TABLET | Freq: Three times a day (TID) | ORAL | 0 refills | Status: AC | PRN

## 2024-06-07 NOTE — Discharge Instructions (Signed)
 You were seen today for a sore throat, fever, headache, vomiting, congestion, and pain in the right lower part of your abdomen. Your tests for strep, flu, and COVID were negative today, but because your symptoms started only one day ago, these results Morrone be too early to be reliable. You should retest for COVID and flu in two days using a home test or by following up with your primary care provider or returning to urgent care. Your urine test showed mild dehydration, which is likely due to not drinking enough because you have been sick. Your symptoms are most consistent with a viral illness, but because you have pain in the right lower abdomen, appendicitis is still possible. Please take ondansetron  as prescribed to help with nausea. Drink plenty of fluids and take small, frequent sips if you feel nauseated. Rest, use Tylenol  or ibuprofen  for fever or discomfort, and eat light foods like broth, crackers, or toast until your appetite returns. Follow up with your primary care provider within 24-48 hours if you are not feeling better. Go to the emergency department right away if your abdominal pain gets worse, especially if it moves to the right lower side or becomes sharp, or if pain increases with walking, coughing, or touching your stomach. Seek emergency care if you develop a higher fever, repeated vomiting, cannot keep fluids down, have new diarrhea or constipation, notice blood in your stool, or feel very weak, dizzy, or unable to stand up straight due to pain. These Fulfer be signs of appendicitis or a more serious infection.

## 2024-06-07 NOTE — Telephone Encounter (Signed)
 FYI Only or Action Required?: FYI only for provider: ED advised.  Patient was last seen in primary care on 02/02/2023 by Ricardo Suzzane POUR, MD.  Called Nurse Triage reporting Abdominal Pain.  Symptoms began yesterday.  Interventions attempted: Nothing.  Symptoms are: rapidly worsening.  Triage Disposition: Go to ED Now (Notify PCP)  Patient/caregiver understands and will follow disposition?: Yes    Recommended ED now due to patient has not urinated > 12 hours. Low abdominal pain moderate to severe but severe when touching.         Copied from CRM #[id number]. Topic: Clinical - Red Word Triage >> Jun 07, 2024  2:18 PM Delon DASEN wrote: Red Word that prompted transfer to Nurse Triage: Fever 100, right side abdominal pain, headaches, sore throat Reason for Disposition  [1] Unable to urinate (or only a few drops) > 4 hours AND [2] bladder feels very full (e.g., palpable bladder or strong urge to urinate)  Answer Assessment - Initial Assessment Questions Recommended ED now due to not urinating >12 hours and low abdominal pain. Recommended patient attempt to urinate and monitor if blood noted or only drops of urine go to ED. Recommended worsening sx call 911. No chest pain no difficulty breathing at this time. No weakness of feeling like passing out. No rapid heart beat.    Call disconnected during transfer from PAS, called patient back.     1. LOCATION: Where does it hurt?      Low abdominal area right side very tender to touch 2. RADIATION: Does the pain shoot anywhere else? (e.g., chest, back)     Back  3. ONSET: When did the pain begin? (Minutes, hours or days ago)      Yesterday  4. SUDDEN: Gradual or sudden onset?     Gradual  5. PATTERN Does the pain come and go, or is it constant?     Comes and goes  6. SEVERITY: How bad is the pain?  (e.g., Scale 1-10; mild, moderate, or severe)     With touch moderate to severe 7. RECURRENT SYMPTOM: Have you ever had this  type of stomach pain before? If Yes, ask: When was the last time? and What happened that time?      Na  8. CAUSE: What do you think is causing the stomach pain? (e.g., gallstones, recent abdominal surgery)     Not sure  9. RELIEVING/AGGRAVATING FACTORS: What makes it better or worse? (e.g., antacids, bending or twisting motion, bowel movement)     Worsening pain when touching low abdomen 10. OTHER SYMPTOMS: Do you have any other symptoms? (e.g., back pain, diarrhea, fever, urination pain, vomiting)       Has not urinated since yesterday > 12 hours and low abdominal pain. Sore throat, headache fever 100.1 yesterday.  Protocols used: Abdominal Pain - Male-A-AH

## 2024-06-07 NOTE — Telephone Encounter (Signed)
 Noted advised ED

## 2024-06-07 NOTE — ED Notes (Signed)
 Pt tolerated po.  Denies any nausea.

## 2024-06-07 NOTE — ED Triage Notes (Signed)
 Pt reports sore throat in the morning, headache, right lower quadrant pain since this morning. Pain is worse if pt put pressure on. Pt reports he had chills and fever last night. Last bowel movement was yesterday. Pt not taking any meds for complaints.

## 2024-06-07 NOTE — ED Provider Notes (Signed)
 [facility]    CSN: [id number] Arrival date & time: 06/07/24  1547      History   Chief Complaint Chief Complaint  Patient presents with   Abdominal Pain    Feel sick abdominal pain  fever - Entered by patient    HPI Ricardo Stevens is a 28 y.o. male.   Discussed the use of AI scribe software for clinical note transcription with the patient, who gave verbal consent to proceed.   The patient presents with sore throat, headache, and right lower abdominal pain that began this morning. Prior to today, he felt well aside from back pain that started yesterday. Last night he developed chills and a fever of 100.60F. The right lower abdominal pain is worsened by pressure. He also reports nasal congestion, runny nose, and sneezing. He experienced nausea and vomiting last night, noting that even sipping a Coca-Cola induced vomiting. His appetite is poor, and he was unable to eat earlier today. He has not been drinking much fluid and reports stomach discomfort when urinating, but denies burning, blood in the urine, or other urinary symptoms. He has not taken any medications for symptom relief and denies known sick contacts. His last bowel movement was normal yesterday. He denies significant cough and has no history of abdominal surgeries.  The following sections of the patient's history were reviewed and updated as appropriate: allergies, current medications, past family history, past medical history, past social history, past surgical history, and problem list.     Past Medical History:  Diagnosis Date   ADHD (attention deficit hyperactivity disorder)    no meds since high school   Anxiety    tx with effexor    Asthma    childhood, no problems as an adult, no inhaler   Cellulitis and abscess of foot 10/10/2014   Cellulitis of leg, left 10/09/2014   Concussion    in high schoold, no deficits   Depression    tx with effexor    Epigastric pain 05/12/2018   Head injury, acute,  without loss of consciousness    in high school, no deficits   Mononucleosis 07/2017   Non-intractable vomiting 05/12/2018   Splenomegaly 08/18/2017    Patient Active Problem List   Diagnosis Date Noted   Encounter for general adult medical examination with abnormal findings 02/02/2023   Mild intermittent asthma without complication 02/02/2023   Chronic gastritis without bleeding 02/02/2023   Depression with anxiety 06/09/2021    Past Surgical History:  Procedure Laterality Date   ADENOIDECTOMY     BIOPSY  07/14/2018   Procedure: BIOPSY;  Surgeon: Harvey Margo CROME, MD;  Location: [facility];  Service: Endoscopy;;   CIRCUMCISION     ESOPHAGOGASTRODUODENOSCOPY N/A 07/14/2018   Dr. harvey: Gastritis/duodenitis.  Small bowel biopsies benign, gastric biopsies with gastritis but no H. pylori.   OPEN REDUCTION INTERNAL FIXATION (ORIF) METACARPAL Left 01/22/2021   Procedure: OPEN REDUCTION INTERNAL FIXATION (ORIF) LEFT FOURTH METACARPAL FRACTURE AND FIFTH METACARPAL FRACTURE;  Surgeon: Camella Fallow, MD;  Location: [facility];  Service: Orthopedics;  Laterality: Left;   TONSILLECTOMY     TYMPANOSTOMY TUBE PLACEMENT Bilateral    WISDOM TOOTH EXTRACTION         Home Medications    Prior to Admission medications  Medication Sig Start Date End Date Taking? Authorizing Provider  ondansetron  (ZOFRAN -ODT) 8 MG disintegrating tablet Take 1 tablet (8 mg total) by mouth every 8 (eight) hours as needed for nausea or vomiting. 06/07/24  Yes Nekoda Chock,  Lucie, FNP  acetaminophen -codeine  (TYLENOL  #3) 300-30 MG tablet Take 1-2 tablets by mouth every 4 (four) hours as needed for moderate pain. 03/11/23   Emiliano Leonce CROME, PA-C  albuterol  (VENTOLIN  HFA) 108 (90 Base) MCG/ACT inhaler Inhale 2 puffs into the lungs every 6 (six) hours as needed for wheezing or shortness of breath. 07/12/22   Del Orbe Polanco, Iliana, FNP  famotidine  (PEPCID ) 20 MG tablet Take 1 tablet (20 mg total) by mouth daily as needed  for heartburn or indigestion. 02/02/23   Tobie Suzzane POUR, MD  fluticasone  (FLONASE ) 50 MCG/ACT nasal spray Place 2 sprays into both nostrils daily. 06/25/21   Elnor Fairy HERO, NP  predniSONE  (DELTASONE ) 10 MG tablet Days 1-4 take 4 tablets (40 mg) daily  Days 5-8 take 3 tablets (30 mg) daily, Days 9-11 take 2 tablets (20 mg) daily, Days 12-14 take 1 tablet (10 mg) daily. 04/22/23   Vivienne Delon HERO, PA-C    Family History Family History  Problem Relation Age of Onset   Hypertension Mother    Diabetes Maternal Grandmother    Diabetes Maternal Grandfather    Diabetes Paternal Grandmother    Breast cancer Paternal Grandmother    Diabetes Paternal Grandfather     Social History Social History[1]   Allergies   Patient has no known allergies.   Review of Systems Review of Systems  Constitutional:  Positive for chills and fever (TMAX 100-101).  HENT:  Positive for congestion, rhinorrhea, sneezing and sore throat.   Respiratory:  Negative for cough.   Gastrointestinal:  Positive for abdominal pain, nausea and vomiting. Negative for constipation and diarrhea.  Musculoskeletal:  Positive for back pain.  Neurological:  Positive for headaches.  All other systems reviewed and are negative.    Physical Exam Triage Vital Signs ED Triage Vitals  Encounter Vitals Group     BP 06/07/24 1621 138/88     Girls Systolic BP Percentile --      Girls Diastolic BP Percentile --      Boys Systolic BP Percentile --      Boys Diastolic BP Percentile --      Pulse Rate 06/07/24 1621 82     Resp 06/07/24 1621 18     Temp 06/07/24 1621 99.7 F (37.6 C)     Temp Source 06/07/24 1621 Oral     SpO2 06/07/24 1621 98 %     Weight --      Height --      Head Circumference --      Peak Flow --      Pain Score 06/07/24 1619 5     Pain Loc --      Pain Education --      Exclude from Growth Chart --    No data found.  Updated Vital Signs BP 138/88 (BP Location: Left Arm)   Pulse 82   Temp  99.7 F (37.6 C) (Oral)   Resp 18   SpO2 98%   Visual Acuity Right Eye Distance:   Left Eye Distance:   Bilateral Distance:    Right Eye Near:   Left Eye Near:    Bilateral Near:     Physical Exam Vitals reviewed.  Constitutional:      General: He is awake. He is not in acute distress.    Appearance: Normal appearance. He is well-developed. He is not ill-appearing, toxic-appearing or diaphoretic.  HENT:     Head: Normocephalic.     Right Ear: Tympanic membrane, ear  canal and external ear normal. No drainage, swelling or tenderness. No middle ear effusion. Tympanic membrane is not erythematous.     Left Ear: Tympanic membrane, ear canal and external ear normal. No drainage, swelling or tenderness.  No middle ear effusion. Tympanic membrane is not erythematous.     Nose: Congestion present.     Mouth/Throat:     Lips: Pink.     Mouth: Mucous membranes are moist.     Pharynx: Oropharynx is clear. Uvula midline. Posterior oropharyngeal erythema present. No pharyngeal swelling, oropharyngeal exudate or uvula swelling.     Tonsils: No tonsillar exudate or tonsillar abscesses.  Eyes:     General: Vision grossly intact.     Conjunctiva/sclera: Conjunctivae normal.  Cardiovascular:     Rate and Rhythm: Normal rate.     Heart sounds: Normal heart sounds.  Pulmonary:     Effort: Pulmonary effort is normal. No tachypnea or respiratory distress.     Breath sounds: Normal breath sounds and air entry.  Abdominal:     General: Bowel sounds are normal. There is no distension.     Palpations: Abdomen is soft.     Tenderness: There is abdominal tenderness in the right lower quadrant. There is no right CVA tenderness, left CVA tenderness, guarding or rebound.  Musculoskeletal:        General: Normal range of motion.     Cervical back: Normal range of motion and neck supple.  Lymphadenopathy:     Cervical: Cervical adenopathy present.  Skin:    General: Skin is warm and dry.   Neurological:     General: No focal deficit present.     Mental Status: He is alert and oriented to person, place, and time.  Psychiatric:        Behavior: Behavior is cooperative.      UC Treatments / Results  Labs (all labs ordered are listed, but only abnormal results are displayed) Labs Reviewed  POCT URINE DIPSTICK - Abnormal; Notable for the following components:      Result Value   Ketones, POC UA trace (5) (*)    All other components within normal limits  CBC WITH DIFFERENTIAL/PLATELET  COMPREHENSIVE METABOLIC PANEL WITH GFR  LIPASE  POC COVID19/FLU A&B COMBO  POCT RAPID STREP A (OFFICE)    EKG   Radiology No results found.  Procedures Procedures (including critical care time)  Medications Ordered in UC Medications - No data to display  Initial Impression / Assessment and Plan / UC Course  I have reviewed the triage vital signs and the nursing notes.  Pertinent labs & imaging results that were available during my care of the patient were reviewed by me and considered in my medical decision making (see chart for details).    The patient presents with acute onset sore throat, headache, nasal congestion, fever, vomiting, and right lower quadrant abdominal pain. Exam shows posterior oropharyngeal erythema, cervical adenopathy, nasal congestion, and focal right lower quadrant tenderness without rebound or guarding. He appears ill but nontoxic, with stable vital signs aside from low-grade fever. Rapid strep negative. COVID and flu tests are also negative, though these Burak be falsely negative given the early course of illness. Urinalysis is negative for infection but shows small ketones consistent with mild dehydration due to poor intake. The presentation is most consistent with an acute viral illness; however, appendicitis remains on the differential given localized right lower quadrant pain and systemic symptoms. CBC, CMP & Lipase obtained and results pending. It  is  recommended that he retest for COVID & FLU in two days. A home test is fine, or he [place] schedule a follow-up test with his primary care provider or return to the urgent care if preferred. Ondansetron  was prescribed for nausea, and the importance of increasing hydration and attempting small, frequent sips of fluids was reviewed. Supportive care for viral symptoms was advised. The patient was instructed to follow up with his primary care provider if symptoms do not improve within the next 24-48 hours. He was advised to seek immediate emergency evaluation for worsening abdominal pain, persistent vomiting, fever that rises or does not improve, inability to tolerate fluids, new onset diarrhea or bloody stool, or any signs suggesting progression to appendicitis.  Today's evaluation has revealed no signs of a dangerous process. Discussed diagnosis with patient and/or guardian. Patient and/or guardian aware of their diagnosis, possible red flag symptoms to watch out for and need for close follow up. Patient and/or guardian understands verbal and written discharge instructions. Patient and/or guardian comfortable with plan and disposition.  Patient and/or guardian has a clear mental status at this time, good insight into illness (after discussion and teaching) and has clear judgment to make decisions regarding their care  Documentation was completed with the aid of voice recognition software. Transcription Tapanes contain typographical errors.  Final Clinical Impressions(s) / UC Diagnoses   Final diagnoses:  Acute sore throat  Right lower quadrant abdominal pain  Systemic viral illness     Discharge Instructions      You were seen today for a sore throat, fever, headache, vomiting, congestion, and pain in the right lower part of your abdomen. Your tests for strep, flu, and COVID were negative today, but because your symptoms started only one day ago, these results Crymes be too early to be reliable. You should  retest for COVID and flu in two days using a home test or by following up with your primary care provider or returning to urgent care. Your urine test showed mild dehydration, which is likely due to not drinking enough because you have been sick. Your symptoms are most consistent with a viral illness, but because you have pain in the right lower abdomen, appendicitis is still possible. Please take ondansetron  as prescribed to help with nausea. Drink plenty of fluids and take small, frequent sips if you feel nauseated. Rest, use Tylenol  or ibuprofen  for fever or discomfort, and eat light foods like broth, crackers, or toast until your appetite returns. Follow up with your primary care provider within 24-48 hours if you are not feeling better. Go to the emergency department right away if your abdominal pain gets worse, especially if it moves to the right lower side or becomes sharp, or if pain increases with walking, coughing, or touching your stomach. Seek emergency care if you develop a higher fever, repeated vomiting, cannot keep fluids down, have new diarrhea or constipation, notice blood in your stool, or feel very weak, dizzy, or unable to stand up straight due to pain. These Violet be signs of appendicitis or a more serious infection.     ED Prescriptions     Medication Sig Dispense Auth. Provider   ondansetron  (ZOFRAN -ODT) 8 MG disintegrating tablet Take 1 tablet (8 mg total) by mouth every 8 (eight) hours as needed for nausea or vomiting. 12 tablet Iola Lukes, FNP      PDMP not reviewed this encounter.     [1]  Social History Tobacco Use   Smoking status: Some  Days    Current packs/day: 0.00    Average packs/day: 0.5 packs/day for 2.0 years (1.0 ttl pk-yrs)    Types: Cigarettes    Start date: 03/28/2018    Last attempt to quit: 03/28/2020    Years since quitting: 4.1   Smokeless tobacco: Never  Vaping Use   Vaping status: Never Used  Substance Use Topics   Alcohol use: Yes     Comment: Occa   Drug use: Yes    Types: Marijuana     Iola Lukes, FNP 06/07/24 1812

## 2024-06-08 ENCOUNTER — Ambulatory Visit (HOSPITAL_COMMUNITY): Payer: Self-pay

## 2024-06-08 LAB — COMPREHENSIVE METABOLIC PANEL WITH GFR
ALT: 30 IU/L (ref 0–44)
AST: 23 IU/L (ref 0–40)
Albumin: 5 g/dL (ref 4.3–5.2)
Alkaline Phosphatase: 89 IU/L (ref 47–123)
BUN/Creatinine Ratio: 13 (ref 9–20)
BUN: 14 mg/dL (ref 6–20)
Bilirubin Total: 0.5 mg/dL (ref 0.0–1.2)
CO2: 24 mmol/L (ref 20–29)
Calcium: 9.3 mg/dL (ref 8.7–10.2)
Chloride: 96 mmol/L (ref 96–106)
Creatinine, Ser: 1.08 mg/dL (ref 0.76–1.27)
Globulin, Total: 2.4 g/dL (ref 1.5–4.5)
Glucose: 81 mg/dL (ref 70–99)
Potassium: 4.1 mmol/L (ref 3.5–5.2)
Sodium: 134 mmol/L (ref 134–144)
Total Protein: 7.4 g/dL (ref 6.0–8.5)
eGFR: 97 mL/min/1.73 (ref >59)

## 2024-06-08 LAB — CBC WITH DIFFERENTIAL/PLATELET
Basophils Absolute: 0 x10E3/uL (ref 0.0–0.2)
Basos: 1 %
EOS (ABSOLUTE): 0.1 x10E3/uL (ref 0.0–0.4)
Eos: 1 %
Hematocrit: 47.2 % (ref 37.5–51.0)
Hemoglobin: 15.6 g/dL (ref 13.0–17.7)
Immature Grans (Abs): 0 x10E3/uL (ref 0.0–0.1)
Immature Granulocytes: 0 %
Lymphocytes Absolute: 1.6 x10E3/uL (ref 0.7–3.1)
Lymphs: 24 %
MCH: 30.3 pg (ref 26.6–33.0)
MCHC: 33.1 g/dL (ref 31.5–35.7)
MCV: 92 fL (ref 79–97)
Monocytes Absolute: 0.9 x10E3/uL (ref 0.1–0.9)
Monocytes: 14 %
Neutrophils Absolute: 4 x10E3/uL (ref 1.4–7.0)
Neutrophils: 60 %
Platelets: 278 x10E3/uL (ref 150–450)
RBC: 5.15 x10E6/uL (ref 4.14–5.80)
RDW: 12 % (ref 11.6–15.4)
WBC: 6.6 x10E3/uL (ref 3.4–10.8)

## 2024-06-08 LAB — LIPASE: Lipase: 37 U/L (ref 13–78)
